# Patient Record
Sex: Male | Born: 1995 | Race: White | Hispanic: No | Marital: Single | Smoking: Former smoker
Health system: Southern US, Community
[De-identification: ages and names within clinical notes are randomized; demographics above are authoritative.]

## PROBLEM LIST (undated history)

## (undated) DIAGNOSIS — J302 Other seasonal allergic rhinitis: Secondary | ICD-10-CM

## (undated) DIAGNOSIS — J45909 Unspecified asthma, uncomplicated: Secondary | ICD-10-CM

## (undated) DIAGNOSIS — R112 Nausea with vomiting, unspecified: Secondary | ICD-10-CM

## (undated) DIAGNOSIS — F419 Anxiety disorder, unspecified: Secondary | ICD-10-CM

## (undated) DIAGNOSIS — R519 Headache, unspecified: Secondary | ICD-10-CM

## (undated) DIAGNOSIS — U071 COVID-19: Secondary | ICD-10-CM

## (undated) DIAGNOSIS — T783XXA Angioneurotic edema, initial encounter: Secondary | ICD-10-CM

## (undated) DIAGNOSIS — Z87442 Personal history of urinary calculi: Secondary | ICD-10-CM

## (undated) DIAGNOSIS — K219 Gastro-esophageal reflux disease without esophagitis: Secondary | ICD-10-CM

## (undated) DIAGNOSIS — F329 Major depressive disorder, single episode, unspecified: Secondary | ICD-10-CM

## (undated) DIAGNOSIS — J189 Pneumonia, unspecified organism: Secondary | ICD-10-CM

## (undated) DIAGNOSIS — Z9889 Other specified postprocedural states: Secondary | ICD-10-CM

## (undated) DIAGNOSIS — N200 Calculus of kidney: Secondary | ICD-10-CM

## (undated) DIAGNOSIS — F32A Depression, unspecified: Secondary | ICD-10-CM

## (undated) HISTORY — PX: TONSILLECTOMY: SUR1361

## (undated) HISTORY — DX: Pneumonia, unspecified organism: J18.9

## (undated) HISTORY — DX: Unspecified asthma, uncomplicated: J45.909

## (undated) HISTORY — DX: Anxiety disorder, unspecified: F41.9

## (undated) HISTORY — DX: Calculus of kidney: N20.0

## (undated) HISTORY — DX: COVID-19: U07.1

## (undated) HISTORY — DX: Major depressive disorder, single episode, unspecified: F32.9

## (undated) HISTORY — DX: Angioneurotic edema, initial encounter: T78.3XXA

## (undated) HISTORY — DX: Depression, unspecified: F32.A

## (undated) HISTORY — PX: WISDOM TOOTH EXTRACTION: SHX21

## (undated) HISTORY — PX: NO PAST SURGERIES: SHX2092

---
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
## 2016-07-17 ENCOUNTER — Encounter (HOSPITAL_COMMUNITY): Payer: Self-pay | Admitting: Emergency Medicine

## 2016-07-17 ENCOUNTER — Emergency Department (HOSPITAL_COMMUNITY): Payer: BLUE CROSS/BLUE SHIELD

## 2016-07-17 ENCOUNTER — Emergency Department (HOSPITAL_COMMUNITY)
Admission: EM | Admit: 2016-07-17 | Discharge: 2016-07-17 | Disposition: A | Discharge status: Home / Self Care | Payer: BLUE CROSS/BLUE SHIELD | Attending: Emergency Medicine | Admitting: Emergency Medicine

## 2016-07-17 DIAGNOSIS — Z79899 Other long term (current) drug therapy: Secondary | ICD-10-CM | POA: Diagnosis not present

## 2016-07-17 DIAGNOSIS — R05 Cough: Secondary | ICD-10-CM | POA: Diagnosis not present

## 2016-07-17 DIAGNOSIS — R062 Wheezing: Secondary | ICD-10-CM | POA: Diagnosis not present

## 2016-07-17 DIAGNOSIS — R0602 Shortness of breath: Secondary | ICD-10-CM | POA: Diagnosis not present

## 2016-07-17 DIAGNOSIS — R0789 Other chest pain: Secondary | ICD-10-CM | POA: Insufficient documentation

## 2016-07-17 DIAGNOSIS — F17200 Nicotine dependence, unspecified, uncomplicated: Secondary | ICD-10-CM | POA: Diagnosis not present

## 2016-07-17 DIAGNOSIS — R079 Chest pain, unspecified: Secondary | ICD-10-CM | POA: Diagnosis not present

## 2016-07-17 LAB — CBC WITH DIFFERENTIAL/PLATELET
BASOS ABS: 0 10*3/uL (ref 0.0–0.1)
BASOS PCT: 0 %
Eosinophils Absolute: 0 10*3/uL (ref 0.0–0.7)
Eosinophils Relative: 1 %
HEMATOCRIT: 40 % (ref 39.0–52.0)
Hemoglobin: 13.7 g/dL (ref 13.0–17.0)
Lymphocytes Relative: 24 %
Lymphs Abs: 2 10*3/uL (ref 0.7–4.0)
MCH: 29.7 pg (ref 26.0–34.0)
MCHC: 34.3 g/dL (ref 30.0–36.0)
MCV: 86.8 fL (ref 78.0–100.0)
MONO ABS: 0.7 10*3/uL (ref 0.1–1.0)
Monocytes Relative: 9 %
NEUTROS ABS: 5.7 10*3/uL (ref 1.7–7.7)
NEUTROS PCT: 66 %
Platelets: 249 10*3/uL (ref 150–400)
RBC: 4.61 MIL/uL (ref 4.22–5.81)
RDW: 13.2 % (ref 11.5–15.5)
WBC: 8.5 10*3/uL (ref 4.0–10.5)

## 2016-07-17 LAB — COMPREHENSIVE METABOLIC PANEL
ALT: 16 U/L — ABNORMAL LOW (ref 17–63)
AST: 17 U/L (ref 15–41)
Albumin: 4.4 g/dL (ref 3.5–5.0)
Alkaline Phosphatase: 47 U/L (ref 38–126)
Anion gap: 7 (ref 5–15)
BILIRUBIN TOTAL: 1.1 mg/dL (ref 0.3–1.2)
BUN: 8 mg/dL (ref 6–20)
CO2: 28 mmol/L (ref 22–32)
Calcium: 9.4 mg/dL (ref 8.9–10.3)
Chloride: 102 mmol/L (ref 101–111)
Creatinine, Ser: 0.81 mg/dL (ref 0.61–1.24)
GFR calc Af Amer: >60 mL/min (ref >60)
Glucose, Bld: 97 mg/dL (ref 65–99)
POTASSIUM: 4 mmol/L (ref 3.5–5.1)
Sodium: 137 mmol/L (ref 135–145)
TOTAL PROTEIN: 7.5 g/dL (ref 6.5–8.1)

## 2016-07-17 LAB — LIPASE, BLOOD: LIPASE: 30 U/L (ref 11–51)

## 2016-07-17 LAB — I-STAT CG4 LACTIC ACID, ED: Lactic Acid, Venous: 0.95 mmol/L (ref 0.5–1.9)

## 2016-07-17 LAB — I-STAT TROPONIN, ED: TROPONIN I, POC: 0 ng/mL (ref 0.00–0.08)

## 2016-07-17 LAB — PROTIME-INR
INR: 1.08
Prothrombin Time: 14.1 seconds (ref 11.4–15.2)

## 2016-07-17 LAB — D-DIMER, QUANTITATIVE: D-Dimer, Quant: <0.27 ug/mL-FEU (ref 0.00–0.50)

## 2016-07-17 MED ORDER — IPRATROPIUM-ALBUTEROL 0.5-2.5 (3) MG/3ML IN SOLN
3.0000 mL | Freq: Once | RESPIRATORY_TRACT | Status: AC
  Administered 2016-07-17: 3 mL via RESPIRATORY_TRACT
  Filled 2016-07-17: qty 3

## 2016-07-17 MED ORDER — PREDNISONE 50 MG PO TABS: 50.0000 mg | ORAL_TABLET | Freq: Every day | ORAL | 0 refills | Status: AC

## 2016-07-17 MED ORDER — GI COCKTAIL ~~LOC~~
30.0000 mL | Freq: Once | ORAL | Status: AC
  Administered 2016-07-17: 30 mL via ORAL
  Filled 2016-07-17: qty 30

## 2016-07-17 NOTE — ED Provider Notes (Signed)
[facility]-EMERGENCY DEPT Provider Note   CSN: [id number] Arrival date & time: 07/17/16  1121     History   Chief Complaint Chief Complaint  Patient presents with  . Chest Pain    HPI Dave Hernandez is a 21 y.o. male.  The history is provided by the patient and a relative.  Shortness of Breath  This is a new problem. The average episode lasts 1 week. The problem occurs continuously.The current episode started more than 2 days ago. The problem has not changed since onset.Associated symptoms include cough, wheezing and chest pain (tightness). Pertinent negatives include no fever, no headaches, no rhinorrhea, no neck pain, no sputum production, no hemoptysis, no syncope, no vomiting, no abdominal pain, no rash, no leg pain and no leg swelling. The problem's precipitants include pollens and smoke. Risk factors include smoking. He has tried beta-agonist inhalers for the symptoms. The treatment provided moderate relief. Associated medical issues include asthma and pneumonia (hx of). Associated medical issues do not include COPD, PE, CAD, heart failure, past MI or DVT.    History reviewed. No pertinent past medical history.  There are no active problems to display for this patient.   History reviewed. No pertinent surgical history.     Home Medications    Prior to Admission medications   Medication Sig Start Date End Date Taking? Authorizing Provider  albuterol (PROVENTIL HFA;VENTOLIN HFA) 108 (90 Base) MCG/ACT inhaler Inhale 1 puff into the lungs every 6 (six) hours as needed for wheezing or shortness of breath.   Yes Historical Provider, MD  calcium carbonate (TUMS - DOSED IN MG ELEMENTAL CALCIUM) 500 MG chewable tablet Chew 1 tablet by mouth as needed for indigestion or heartburn.   Yes Historical Provider, MD  ibuprofen (ADVIL,MOTRIN) 200 MG tablet Take 600 mg by mouth every 6 (six) hours as needed for headache or moderate pain.   Yes Historical Provider, MD    Family  History History reviewed. No pertinent family history.  Social History Social History  Substance Use Topics  . Smoking status: Current Every Day Smoker  . Smokeless tobacco: Never Used  . Alcohol use Yes     Allergies   Penicillins   Review of Systems Review of Systems  Constitutional: Negative for appetite change, chills, diaphoresis, fatigue and fever.  HENT: Negative for congestion and rhinorrhea.   Eyes: Negative for visual disturbance.  Respiratory: Positive for cough, chest tightness, shortness of breath and wheezing. Negative for hemoptysis, sputum production and choking.   Cardiovascular: Positive for chest pain (tightness). Negative for palpitations, leg swelling and syncope.  Gastrointestinal: Negative for abdominal pain, constipation, diarrhea, nausea and vomiting.  Genitourinary: Negative for frequency.  Musculoskeletal: Negative for back pain and neck pain.  Skin: Negative for rash and wound.  Neurological: Negative for light-headedness and headaches.  Psychiatric/Behavioral: Negative for agitation and confusion.  All other systems reviewed and are negative.    Physical Exam Updated Vital Signs BP 133/72   Pulse 64   Temp 98.2 F (36.8 C) (Oral)   Resp 16   SpO2 95%   Physical Exam  Constitutional: He is oriented to person, place, and time. He appears well-developed and well-nourished.  HENT:  Head: Normocephalic and atraumatic.  Mouth/Throat: Oropharynx is clear and moist. No oropharyngeal exudate.  Eyes: Conjunctivae and EOM are normal. Pupils are equal, round, and reactive to light.  Neck: Normal range of motion. Neck supple.  Cardiovascular: Normal rate, regular rhythm and intact distal pulses.   No murmur heard.  Strong heart beats can heard but no murmurs.   Pulmonary/Chest: Effort normal. No stridor. No respiratory distress. He has wheezes. He has no rales. He exhibits no tenderness.  Abdominal: Soft. There is no tenderness.  Musculoskeletal:  He exhibits no edema or tenderness.  Neurological: He is alert and oriented to person, place, and time. No sensory deficit. He exhibits normal muscle tone.  Skin: Skin is warm and dry. Capillary refill takes less than 2 seconds. No erythema. No pallor.  Psychiatric: He has a normal mood and affect.  Nursing note and vitals reviewed.    ED Treatments / Results  Labs (all labs ordered are listed, but only abnormal results are displayed) Labs Reviewed  COMPREHENSIVE METABOLIC PANEL - Abnormal; Notable for the following:       Result Value   ALT 16 (*)    All other components within normal limits  CBC WITH DIFFERENTIAL/PLATELET  LIPASE, BLOOD  PROTIME-INR  D-DIMER, QUANTITATIVE (NOT AT [facility])  Rosezena Sensor, ED  I-STAT CG4 LACTIC ACID, ED    EKG  EKG Interpretation  Date/Time:  Tuesday Jul 17 2016 13:36:38 EDT Ventricular Rate:  56 PR Interval:    QRS Duration: 101 QT Interval:  417 QTC Calculation: 403 R Axis:   84 Text Interpretation:  Sinus rhythm Probable LVH with secondary repol abnrm Anterior ST elevation, probably due to LVH Baseline wander in lead(s) I aVR No prior ECG for comparison.  No STEMI Confirmed by Rush Landmark MD, CHRISTOPHER [phone]) on 07/17/2016 1:42:28 PM Also confirmed by Rush Landmark MD, CHRISTOPHER [phone]), editor Rollen Sox [phone])  on 07/17/2016 3:33:06 PM       Radiology Dg Chest 2 View  Result Date: 07/17/2016 CLINICAL DATA:  Mid chest pain and cough. EXAM: CHEST  2 VIEW COMPARISON:  PA and lateral chest 09/03/2006. FINDINGS: The lungs are clear. Heart size is normal. No pneumothorax or pleural effusion. No bony abnormality. IMPRESSION: Negative chest. Electronically Signed   By: Drusilla Kanner M.D.   On: 07/17/2016 12:23    Procedures Procedures (including critical care time)  Medications Ordered in ED Medications  ipratropium-albuterol (DUONEB) 0.5-2.5 (3) MG/3ML nebulizer solution 3 mL (3 mLs Nebulization Given 07/17/16 1340)  gi cocktail  (Maalox,Lidocaine,Donnatal) (30 mLs Oral Given 07/17/16 1340)     Initial Impression / Assessment and Plan / ED Course  I have reviewed the triage vital signs and the nursing notes.  Pertinent labs & imaging results that were available during my care of the patient were reviewed by me and considered in my medical decision making (see chart for details).     Dave Hernandez is a 21 y.o. male With a past medical history significant for asthma and pneumonias who presents to the emergency department with Shortness of breath, wheezing, chest tightness, and chest discomfort. Patient is accompanied by family who reported that for the last week, patient has had his symptoms. He says that he has been sending lots of time outside. He also reports smoking tobacco. Patient describes that his chest is felt tight and he cannot seem to catch his breath. He reports a burning and tightness in his chest. He denies over chest pain. He describes his symptoms as moderate to severe. He denies any fevers or chills but does report a dry cough. He denies any urinary symptoms or G.I. symptoms. He denies any traumas. Patient denies any lightheadedness, syncope, diaphoresis, or nausea. He denies other complaints.  History and exam are seen above.  On exam, patient's lungs had mild  wheezing. Patient had no rhonchi or rales. Patients chest was nontender to palpation. Abdomen nontender. Symmetric pulses in on extremity is. Patient had no murmur but had a very strong and loud heartbeat. Patient had no back tenderness or CV tenderness. No rashes were seen. Normal lower extremity pulses.  Based on patient symptoms, suspect asthma exacerbation in the setting of his smoke exposure and severe environmental allergies from pollen. However, given patient's history of pneumonias, cough, and shortness of breath, labs, imaging, and testing was performed to rule out cardiac, pulmonary, and pneumonia cause of symptoms.  Troponin negative. D  dimer negative. Lactic acid nonelevated. CBC and CMP unremarkable. Chest x-ray showed no pneumonia or edema.  Only abnormality on workup was possible LVH on EKG. Patient denies any family history of cardiac disease or death.  Given the patient's wheezing and shortness of breath, DuoNeb breathing treatment was ordered. With the burning that he reports was associated with a "[employer] spicy chicken sandwich", a G.I. cocktail was also ordered.  Patient's wheezing and shortness of breath significantly improved after breathing treatment. Suspect asthma exacerbation as described above.  Given the LVH and the strong sounding heart, patient offered echocardiogram in the emergency department to investigate, however, patient had shared decision-making conversation and decision made to follow up as an outpatient with PCP and cardiology for further management. Patient given prescription for steroids for asthma exacerbation and patient has inhaler at home. Patient will follow up and understood strict return precautions for any new or worsened symptoms. Patient and family had no other questions or concerns, and patient was discharged in good condition with improvement in presenting symptoms.    Final Clinical Impressions(s) / ED Diagnoses   Final diagnoses:  Chest tightness  Shortness of breath  Wheezing    New Prescriptions Discharge Medication List as of 07/17/2016  3:04 PM    START taking these medications   Details  predniSONE (DELTASONE) 50 MG tablet Take 1 tablet (50 mg total) by mouth daily., Starting Tue 07/17/2016, Until Sun 07/22/2016, Print        Clinical Impression: 1. Chest tightness   2. Shortness of breath   3. Wheezing     Disposition: Discharge  Condition: Good  I have discussed the results, Dx and Tx plan with the pt(& family if present). He/she/they expressed understanding and agree(s) with the plan. Discharge instructions discussed at great length. Strict return precautions  discussed and pt &/or family have verbalized understanding of the instructions. No further questions at time of discharge.    Discharge Medication List as of 07/17/2016  3:04 PM    START taking these medications   Details  predniSONE (DELTASONE) 50 MG tablet Take 1 tablet (50 mg total) by mouth daily., Starting Tue 07/17/2016, Until Sun 07/22/2016, Print        Follow Up: [facility] [address] [place] [address] [phone] Schedule an appointment as soon as possible for a visit    [facility] CARDIOVASCULAR DIVISION [address] [place] [address] [phone] Schedule an appointment as soon as possible for a visit    [facility] EMERGENCY DEPARTMENT [address] [id number] mc [place] [address] [phone]  If symptoms worsen     Heide Scales, MD 07/17/16 2000

## 2016-07-17 NOTE — ED Triage Notes (Signed)
Pt sts chest tightness and some cough; pt sts had dental abscess rupture 1 week ago

## 2016-07-17 NOTE — ED Notes (Signed)
Pt given ice water, per Chelsea, RN. 

## 2016-07-17 NOTE — ED Notes (Signed)
Patient transported to X-ray 

## 2016-07-17 NOTE — ED Notes (Signed)
Dr. Tegler at bedside  

## 2016-07-17 NOTE — Discharge Instructions (Signed)
Please call and schedule an appointment with a primary care physician as well as a cardiologist for further workup of your symptoms. We feel you're having an asthma exacerbation causing the wheezing, shortness of breath, and chest tightness. It improved after your breathing treatments. Please take the steroids for the next several days. If any symptoms change or worsen, please return to the nearest emergency department.

## 2016-07-17 NOTE — ED Notes (Signed)
Got patient undress into a gown on the monitor waiting provider

## 2016-07-19 ENCOUNTER — Encounter: Payer: Self-pay | Admitting: Family Medicine

## 2016-07-19 ENCOUNTER — Ambulatory Visit (INDEPENDENT_AMBULATORY_CARE_PROVIDER_SITE_OTHER): Payer: BLUE CROSS/BLUE SHIELD | Admitting: Family Medicine

## 2016-07-19 VITALS — BP 142/72 | HR 70 | Temp 97.9°F | Resp 16 | Ht 75.0 in | Wt 172.0 lb

## 2016-07-19 DIAGNOSIS — J45909 Unspecified asthma, uncomplicated: Secondary | ICD-10-CM

## 2016-07-19 DIAGNOSIS — F418 Other specified anxiety disorders: Secondary | ICD-10-CM | POA: Diagnosis not present

## 2016-07-19 DIAGNOSIS — R0602 Shortness of breath: Secondary | ICD-10-CM | POA: Diagnosis not present

## 2016-07-19 MED ORDER — VENLAFAXINE HCL ER 150 MG PO CP24: 150.0000 mg | ORAL_CAPSULE | Freq: Every day | ORAL | 0 refills | Status: DC

## 2016-07-19 MED ORDER — BUDESONIDE-FORMOTEROL FUMARATE 80-4.5 MCG/ACT IN AERO: 2.0000 | INHALATION_SPRAY | Freq: Two times a day (BID) | RESPIRATORY_TRACT | 3 refills | Status: DC

## 2016-07-19 MED ORDER — MONTELUKAST SODIUM 10 MG PO TABS: 10.0000 mg | ORAL_TABLET | Freq: Every day | ORAL | 3 refills | Status: DC

## 2016-07-19 MED ORDER — CETIRIZINE HCL 10 MG PO TABS: 10.0000 mg | ORAL_TABLET | Freq: Every day | ORAL | 11 refills | Status: DC

## 2016-07-19 MED ORDER — IPRATROPIUM BROMIDE 0.02 % IN SOLN
0.5000 mg | Freq: Once | RESPIRATORY_TRACT | Status: AC
  Administered 2016-07-19: 0.5 mg via RESPIRATORY_TRACT

## 2016-07-19 MED ORDER — ALBUTEROL SULFATE (2.5 MG/3ML) 0.083% IN NEBU
2.5000 mg | INHALATION_SOLUTION | Freq: Once | RESPIRATORY_TRACT | Status: AC
  Administered 2016-07-19: 2.5 mg via RESPIRATORY_TRACT

## 2016-07-19 NOTE — Progress Notes (Signed)
Patient ID: Dave Hernandez, male    DOB: 1995/11/23, 21 y.o.   MRN: [id number]  PCP: Joaquin Courts, FNP  Chief Complaint  Patient presents with  . Establish Care  . Hospitalization Follow-up    Subjective:  HPI  Dave Hernandez is a 21 y.o. male presents to establish care and hospital follow-up. Significant medical history includes asthma and seasonal allergies.  Asthma/Allergies Dave Hernandez describes worsening chest tightness with shortness of breath with activity over a period of two weeks. He admits to chronic vapeing and marijuana use. Feels chest tightness, is related to vapeing more so that marijuana smoking as he reports long-time usage. Events that led up to recent emergency department encounter include, 4/30 experiencing a sudden onset of heart racing and chest tightness (not pain) to the point of feeling as if "someone was standing on his chest blocking his throat". He became afraid and went to [facility] Emergency Department for further work-up and evaluation. Upon arrival to the ED,  Dave Hernandez reported associated cough, wheezing, shortness of breath, and chest pain.  Symptoms were further exacerbated by exposure pollen and smoking. He admits today that he last smoked marijuana on Monday. Cardiac cause was ruled out. He was treated symptomatically only and discharged with instructions to start a 5 day coarse of prednisone and use albuterol inhaler as needed. As of today, he reports improvement of chest tightness, although sensation of tightness remains and he continues to experience shortness of breath. He has used albuterol with relief of chest tightness. Not currently taking an chronic antihistamines or using any asthma prevention medications.  Anxiety and Depression  Dave Hernandez reports over the last 6 months feeling hopeless and worthless .  "I had hoped this year, my life would be better and things in my personal life seemed to have gotten worst".  He is lost his mother in  2008, grandmother passed 1 year ago, and close friend passed this week.  He feels alone and finds difficulty expressing his feelings verbally. No prior diagnosis of depression or anxiety. Feels that his chest tightness is in some part related to stress.  Feels that emotionally he is getting worst as he is crying for no reason. Reports smoking marijuana is the only thing that helps him relax and feel normal. Denies suicidal thoughts and or plan.    Social History   Social History  . Marital status: Single    Spouse name: N/A  . Number of children: N/A  . Years of education: N/A   Occupational History  . Not on file.   Social History Main Topics  . Smoking status: Current Every Day Smoker  . Smokeless tobacco: Never Used  . Alcohol use Yes  . Drug use: Yes    Types: Marijuana  . Sexual activity: Not on file   Other Topics Concern  . Not on file   Social History Narrative  . No narrative on file    History reviewed. No pertinent family history.  Review of Systems See HPI  Allergies  Allergen Reactions  . Penicillins Other (See Comments)    Childhood allergy    Prior to Admission medications   Medication Sig Start Date End Date Taking? Authorizing Provider  predniSONE (DELTASONE) 50 MG tablet Take 1 tablet (50 mg total) by mouth daily. 07/17/16 07/22/16 Yes Canary Brim Tegeler, MD  albuterol (PROVENTIL HFA;VENTOLIN HFA) 108 (90 Base) MCG/ACT inhaler Inhale 1 puff into the lungs every 6 (six) hours as needed for wheezing or shortness of  breath.    Historical Provider, MD  calcium carbonate (TUMS - DOSED IN MG ELEMENTAL CALCIUM) 500 MG chewable tablet Chew 1 tablet by mouth as needed for indigestion or heartburn.    Historical Provider, MD  ibuprofen (ADVIL,MOTRIN) 200 MG tablet Take 600 mg by mouth every 6 (six) hours as needed for headache or moderate pain.    Historical Provider, MD    Past Medical, Surgical Family and Social History reviewed and updated.     Objective:   Today's Vitals   07/19/16 1057  BP: (!) 142/77  Pulse: 70  Resp: 16  Temp: 97.9 F (36.6 C)  SpO2: 100%  Weight: 172 lb (78 kg)  Height: 6\' 3"  (1.905 m)    Wt Readings from Last 3 Encounters:  07/19/16 172 lb (78 kg)   Physical Exam  Constitutional: He is oriented to person, place, and time. He appears well-developed and well-nourished.  HENT:  Head: Normocephalic and atraumatic.  Eyes: Pupils are equal, round, and reactive to light.  Neck: Normal range of motion. Neck supple.  Cardiovascular: Normal rate, regular rhythm, normal heart sounds and intact distal pulses.   Pulmonary/Chest: Effort normal and breath sounds normal.  Musculoskeletal: Normal range of motion.  Neurological: He is alert and oriented to person, place, and time.  Skin: Skin is warm and dry.  Psychiatric: He has a normal mood and affect. His behavior is normal. Judgment and thought content normal.     Assessment & Plan:  1. Depression with anxiety - Venlafaxine (Effexor) 150 mg once daily.  2. Uncomplicated asthma, unspecified asthma severity, unspecified whether persistent -Symbicort 2 puffs, twice daily regardless of symptoms -Albuterol 2 puffs, every 4-6 hours as needed  3. Shortness of breath, Subjective, asymptomatic on exam. - albuterol (PROVENTIL) (2.5 MG/3ML) 0.083% nebulizer solution 2.5 mg; Take 3 mLs (2.5 mg total) by nebulization once. - ipratropium (ATROVENT) nebulizer solution 0.5 mg; Take 2.5 mLs (0.5 mg total) by nebulization once.   RTC: 3 weeks to follow-up on anxiety,depression, and asthma symptoms.    Godfrey PickKimberly S. Tiburcio PeaHarris, MSN, [facility] Campus[facility]NP-C [facility] [address] [place], [place] [id number] [phone]

## 2016-07-19 NOTE — Patient Instructions (Addendum)
For depression and anxiety -Start Effexor 150 mg once daily    Uncomplicated asthma and allergy -Symbicort 2 puffs, twice daily regardless of symptoms. -Albuterol 2 puffs, every 4-6 hours as needed. -Singulair 10 mg daily regardless of symptoms -Start over the counter Zyrtec 10 mg once daily.     Panic Attacks Panic attacks are sudden, short feelings of great fear or discomfort. You may have them for no reason when you are relaxed, when you are uneasy (anxious), or when you are sleeping. Follow these instructions at home:  Take all your medicines as told.  Check with your doctor before starting new medicines.  Keep all doctor visits. Contact a doctor if:  You are not able to take your medicines as told.  Your symptoms do not get better.  Your symptoms get worse. Get help right away if:  Your attacks seem different than your normal attacks.  You have thoughts about hurting yourself or others.  You take panic attack medicine and you have a side effect. This information is not intended to replace advice given to you by your health care provider. Make sure you discuss any questions you have with your health care provider. Document Released: 04/07/2010 Document Revised: 08/11/2015 Document Reviewed: 10/17/2012 Elsevier Interactive Patient Education  2017 Elsevier Inc.  Living With Anxiety After being diagnosed with an anxiety disorder, you may be relieved to know why you have felt or behaved a certain way. It is natural to also feel overwhelmed about the treatment ahead and what it will mean for your life. With care and support, you can manage this condition and recover from it. How to cope with anxiety Dealing with stress  Stress is your body's reaction to life changes and events, both good and bad. Stress can last just a few hours or it can be ongoing. Stress can play a major role in anxiety, so it is important to learn both how to cope with stress and how to think about it  differently. Talk with your health care provider or a counselor to learn more about stress reduction. He or she may suggest some stress reduction techniques, such as:  Music therapy. This can include creating or listening to music that you enjoy and that inspires you.  Mindfulness-based meditation. This involves being aware of your normal breaths, rather than trying to control your breathing. It can be done while sitting or walking.  Centering prayer. This is a kind of meditation that involves focusing on a word, phrase, or sacred image that is meaningful to you and that brings you peace.  Deep breathing. To do this, expand your stomach and inhale slowly through your nose. Hold your breath for 3-5 seconds. Then exhale slowly, allowing your stomach muscles to relax.  Self-talk. This is a skill where you identify thought patterns that lead to anxiety reactions and correct those thoughts.  Muscle relaxation. This involves tensing muscles then relaxing them. Choose a stress reduction technique that fits your lifestyle and personality. Stress reduction techniques take time and practice. Set aside 5-15 minutes a day to do them. Therapists can offer training in these techniques. The training may be covered by some insurance plans. Other things you can do to manage stress include:  Keeping a stress diary. This can help you learn what triggers your stress and ways to control your response.  Thinking about how you respond to certain situations. You may not be able to control everything, but you can control your reaction.  Making time for  activities that help you relax, and not feeling guilty about spending your time in this way. Therapy combined with coping and stress-reduction skills provides the best chance for successful treatment. Medicines  Medicines can help ease symptoms. Medicines for anxiety include:  Anti-anxiety drugs.  Antidepressants.  Beta-blockers. Medicines may be used as the main  treatment for anxiety disorder, along with therapy, or if other treatments are not working. Medicines should be prescribed by a health care provider. Relationships  Relationships can play a big part in helping you recover. Try to spend more time connecting with trusted friends and family members. Consider going to couples counseling, taking family education classes, or going to family therapy. Therapy can help you and others better understand the condition. How to recognize changes in your condition Everyone has a different response to treatment for anxiety. Recovery from anxiety happens when symptoms decrease and stop interfering with your daily activities at home or work. This may mean that you will start to:  Have better concentration and focus.  Sleep better.  Be less irritable.  Have more energy.  Have improved memory. It is important to recognize when your condition is getting worse. Contact your health care provider if your symptoms interfere with home or work and you do not feel like your condition is improving. Where to find help and support: You can get help and support from these sources:  Self-help groups.  Online and [employer].  A trusted spiritual leader.  Couples counseling.  Family education classes.  Family therapy. Follow these instructions at home:  Eat a healthy diet that includes plenty of vegetables, fruits, whole grains, low-fat dairy products, and lean protein. Do not eat a lot of foods that are high in solid fats, added sugars, or salt.  Exercise. Most adults should do the following:  Exercise for at least 150 minutes each week. The exercise should increase your heart rate and make you sweat (moderate-intensity exercise).  Strengthening exercises at least twice a week.  Cut down on caffeine, tobacco, alcohol, and other potentially harmful substances.  Get the right amount and quality of sleep. Most adults need 7-9 hours of sleep each  night.  Make choices that simplify your life.  Take over-the-counter and prescription medicines only as told by your health care provider.  Avoid caffeine, alcohol, and certain over-the-counter cold medicines. These may make you feel worse. Ask your pharmacist which medicines to avoid.  Keep all follow-up visits as told by your health care provider. This is important. Questions to ask your health care provider  Would I benefit from therapy?  How often should I follow up with a health care provider?  How long do I need to take medicine?  Are there any long-term side effects of my medicine?  Are there any alternatives to taking medicine? Contact a health care provider if:  You have a hard time staying focused or finishing daily tasks.  You spend many hours a day feeling worried about everyday life.  You become exhausted by worry.  You start to have headaches, feel tense, or have nausea.  You urinate more than normal.  You have diarrhea. Get help right away if:  You have a racing heart and shortness of breath.  You have thoughts of hurting yourself or others. If you ever feel like you may hurt yourself or others, or have thoughts about taking your own life, get help right away. You can go to your nearest emergency department or call:  Your local emergency  services (911 in the [place]  A suicide crisis helpline, such as the National Suicide Prevention Lifeline at (548)453-3947. This is open 24-hours a day. Summary  Taking steps to deal with stress can help calm you.  Medicines cannot cure anxiety disorders, but they can help ease symptoms.  Family, friends, and partners can play a big part in helping you recover from an anxiety disorder. This information is not intended to replace advice given to you by your health care provider. Make sure you discuss any questions you have with your health care provider. Document Released: 02/28/2016 Document Revised: 02/28/2016  Document Reviewed: 02/28/2016 Elsevier Interactive Patient Education  2017 Elsevier Inc.  Major Depressive Disorder, Adult Major depressive disorder (MDD) is a mental health condition. MDD often makes you feel sad, hopeless, or helpless. MDD can also cause symptoms in your body. MDD can affect your:  Work.  School.  Relationships.  Other normal activities. MDD can range from mild to very bad. It may occur once (single episode MDD). It can also occur many times (recurrent MDD). The main symptoms of MDD often include:  Feeling sad, depressed, or irritable most of the time.  Loss of interest. MDD symptoms also include:  Sleeping too much or too little.  Eating too much or too little.  A change in your weight.  Feeling tired (fatigue) or having low energy.  Feeling worthless.  Feeling guilty.  Trouble making decisions.  Trouble thinking clearly.  Thoughts of suicide or harming others.  Feeling weak.  Feeling agitated.  Keeping yourself from being around other people (isolation). Follow these instructions at home: Activity   Do these things as told by your doctor:  Go back to your normal activities.  Exercise regularly.  Spend time outdoors. Alcohol   Talk with your doctor about how alcohol can affect your antidepressant medicines.  Do not drink alcohol. Or, limit how much alcohol you drink.  This means no more than 1 drink a day for nonpregnant women and 2 drinks a day for men. One drink equals one of these:  12 oz of beer.  5 oz of wine.  1 oz of hard liquor. General instructions   Take over-the-counter and prescription medicines only as told by your doctor.  Eat a healthy diet.  Get plenty of sleep.  Find activities that you enjoy. Make time to do them.  Think about joining a support group. Your doctor may be able to suggest a group for you.  Keep all follow-up visits as told by your doctor. This is important. Where to find more  information:   [employer] on Mental Illness:  www.nami.org  [place] [employer] of Mental Health:  http://www.maynard.net/  National Suicide Prevention Lifeline:  315-764-7621. This is free, 24-hour help. Contact a doctor if:  Your symptoms get worse.  You have new symptoms. Get help right away if:  You self-harm.  You see, hear, taste, smell, or feel things that are not present (hallucinate). If you ever feel like you may hurt yourself or others, or have thoughts about taking your own life, get help right away. You can go to your nearest emergency department or call:  Your local emergency services (911 in the [place]  A suicide crisis helpline, such as the National Suicide Prevention Lifeline:  (980)538-8587. This is open 24 hours a day. This information is not intended to replace advice given to you by your health care provider. Make sure you discuss any questions you have with your health care  provider. Document Released: 02/14/2015 Document Revised: 11/20/2015 Document Reviewed: 11/20/2015 Elsevier Interactive Patient Education  2017 [employer]

## 2016-07-20 LAB — POCT URINALYSIS DIP (DEVICE)
BILIRUBIN URINE: NEGATIVE
GLUCOSE, UA: NEGATIVE mg/dL
KETONES UR: NEGATIVE mg/dL
Leukocytes, UA: NEGATIVE
Nitrite: NEGATIVE
Protein, ur: NEGATIVE mg/dL
SPECIFIC GRAVITY, URINE: 1.02 (ref 1.005–1.030)
Urobilinogen, UA: 0.2 mg/dL (ref 0.0–1.0)
pH: 6.5 (ref 5.0–8.0)

## 2016-08-09 ENCOUNTER — Encounter: Payer: Self-pay | Admitting: Family Medicine

## 2016-08-09 ENCOUNTER — Ambulatory Visit (INDEPENDENT_AMBULATORY_CARE_PROVIDER_SITE_OTHER): Payer: BLUE CROSS/BLUE SHIELD | Admitting: Family Medicine

## 2016-08-09 VITALS — BP 135/77 | HR 60 | Temp 97.8°F | Resp 16 | Ht 75.0 in | Wt 184.0 lb

## 2016-08-09 DIAGNOSIS — J45909 Unspecified asthma, uncomplicated: Secondary | ICD-10-CM | POA: Diagnosis not present

## 2016-08-09 DIAGNOSIS — F418 Other specified anxiety disorders: Secondary | ICD-10-CM

## 2016-08-09 NOTE — Progress Notes (Signed)
Patient ID: Dave HildingBrandon L Hernandez, male    DOB: 03/13/1996, 21 y.o.   MRN: [id number]  PCP: Bing NeighborsHarris, Jomari Bartnik S, FNP  Chief Complaint  Patient presents with  . Follow-up    Depression,Anxiety    Subjective:  HPI Dave Hernandez is a 21 y.o. male presents for 2 week follow-up of anxiety and depression. Significant medical history includes asthma and seasonal allergies.  During Nikai's last office visit, he was treated for shortness of breath related to asthma and anxiety. He was found to be experiencing significant depression and started on Effexor. He had been chronically smoking marijuana to cope with symptoms.  Today he presents reporting improvement of symptoms, despite opting not to start antidepressant medication. Dave JunesBrandon was concerned with the side effects of medication and felt that he did not need antidepressant therapy.  He has been regularly using prescribed inhaler, although inappropriately. He has been using the long-acting inhaler for episodes of acute shortness of breath and using the short-acting daily regardless of symptoms. He confirms that he is taking Zrytec 10 mg daily. Overall he feels that his shortness of breath has improved and reports only 1 episode of wheezing.   Social History   Social History  . Marital status: Single    Spouse name: N/A  . Number of children: N/A  . Years of education: N/A   Occupational History  . Not on file.   Social History Main Topics  . Smoking status: Current Every Day Smoker  . Smokeless tobacco: Never Used  . Alcohol use Yes  . Drug use: Yes    Types: Marijuana  . Sexual activity: Not on file   Other Topics Concern  . Not on file   Social History Narrative  . No narrative on file   History reviewed. No pertinent family history.   Review of Systems See HPI Patient Active Problem List   Diagnosis Date Noted  . Depression with anxiety 07/19/2016  . Uncomplicated asthma 07/19/2016  . Shortness of breath  07/19/2016    Allergies  Allergen Reactions  . Penicillins Other (See Comments)    Childhood allergy    Prior to Admission medications   Medication Sig Start Date End Date Taking? Authorizing Provider  albuterol (PROVENTIL HFA;VENTOLIN HFA) 108 (90 Base) MCG/ACT inhaler Inhale 1 puff into the lungs every 6 (six) hours as needed for wheezing or shortness of breath.   Yes [provider]  budesonide-formoterol (SYMBICORT) 80-4.5 MCG/ACT inhaler Inhale 2 puffs into the lungs 2 (two) times daily. 07/19/16  Yes Bing NeighborsHarris, Alia Parsley S, FNP  calcium carbonate (TUMS - DOSED IN MG ELEMENTAL CALCIUM) 500 MG chewable tablet Chew 1 tablet by mouth as needed for indigestion or heartburn.   Yes [provider]  cetirizine (ZYRTEC) 10 MG tablet Take 1 tablet (10 mg total) by mouth daily. 07/19/16  Yes Bing NeighborsHarris, Getsemani Lindon S, FNP  ibuprofen (ADVIL,MOTRIN) 200 MG tablet Take 600 mg by mouth every 6 (six) hours as needed for headache or moderate pain.   Yes [provider]  montelukast (SINGULAIR) 10 MG tablet Take 1 tablet (10 mg total) by mouth at bedtime. 07/19/16  Yes Bing NeighborsHarris, Elan Brainerd S, FNP  venlafaxine XR (EFFEXOR XR) 150 MG 24 hr capsule Take 1 capsule (150 mg total) by mouth daily with breakfast. 07/19/16  Yes Bing NeighborsHarris, Elmina Hendel S, FNP    Past Medical, Surgical Family and Social History reviewed and updated.    Objective:   Today's Vitals   08/09/16 1133  BP: 135/77  Pulse: 60  Resp: 16  Temp: 97.8 F (36.6 C)  TempSrc: Oral  SpO2: 99%  Weight: 184 lb (83.5 kg)  Height: 6\' 3"  (1.905 m)    Wt Readings from Last 3 Encounters:  08/09/16 184 lb (83.5 kg)  07/19/16 172 lb (78 kg)    Physical Exam  Constitutional: He is oriented to person, place, and time. He appears well-developed and well-nourished.  HENT:  Head: Normocephalic and atraumatic.  Eyes: Pupils are equal, round, and reactive to light.  Neck: Normal range of motion.  Cardiovascular: Normal rate, regular rhythm,  normal heart sounds and intact distal pulses.   Pulmonary/Chest: Effort normal and breath sounds normal.  Musculoskeletal: Normal range of motion.  Neurological: He is alert and oriented to person, place, and time.  Skin: Skin is warm and dry.  Psychiatric: Judgment and thought content normal. His mood appears anxious. He is hyperactive. Cognition and memory are normal.     Assessment & Plan:  1. Depression with anxiety -Recommended starting Effexor if anxiety and or depression resurfaced. He is to contact me if he decides to take medication as he already filled prescription.  2. Uncomplicated asthma, unspecified asthma severity, unspecified whether persistent -Educated on short acting vs long acting inhaler and indications for use. Patient verbalized understanding and had both inhalers present during visit. -Continue all therapy as previously prescribed.  RTC: 3 months-asthma, depression, and anxiety follow-up.  Godfrey Pick. Tiburcio Pea, MSN, FNP-C [facility]  [address] [place], [place] [address] [phone]

## 2016-08-09 NOTE — Patient Instructions (Signed)
Remember to keep your short acting, immediate release inhaler with you for acute episodes of shortness or breath and or wheezing.    Asthma Attack Prevention, Adult Although you may not be able to control the fact that you have asthma, you can take actions to prevent episodes of asthma (asthma attacks). These actions include:  Creating a written plan for managing and treating your asthma attacks (asthma action plan).  Monitoring your asthma.  Avoiding things that can irritate your airways or make your asthma symptoms worse (asthma triggers).  Taking your medicines as directed.  Acting quickly if you have signs or symptoms of an asthma attack. What are some ways to prevent an asthma attack? Create a plan  Work with your health care provider to create an asthma action plan. This plan should include:  A list of your asthma triggers and how to avoid them.  A list of symptoms that you experience during an asthma attack.  Information about when to take medicine and how much medicine to take.  Information to help you understand your peak flow measurements.  Contact information for your health care providers.  Daily actions that you can take to control asthma. Monitor your asthma   To monitor your asthma:  Use your peak flow meter every morning and every evening for 2-3 weeks. Record the results in a journal. A drop in your peak flow numbers on one or more days may mean that you are starting to have an asthma attack, even if you are not having symptoms.  When you have asthma symptoms, write them down in a journal. Avoid asthma triggers   Work with your health care provider to find out what your asthma triggers are. This can be done by:  Being tested for allergies.  Keeping a journal that notes when asthma attacks occur and what may have contributed to them.  Asking your health care provider whether other medical conditions make your asthma worse. Common asthma triggers  include:  Dust.  Smoke. This includes campfire smoke and secondhand smoke from tobacco products.  Pet dander.  Trees, grasses or pollens.  Very cold, dry, or humid air.  Mold.  Foods that contain high amounts of sulfites.  Strong smells.  Engine exhaust and air pollution.  Aerosol sprays and fumes from household cleaners.  Household pests and their droppings, including dust mites and cockroaches.  Certain medicines, including NSAIDs. Once you have determined your asthma triggers, take steps to avoid them. Depending on your triggers, you may be able to reduce the chance of an asthma attack by:  Keeping your home clean. Have someone dust and vacuum your home for you 1 or 2 times a week. If possible, have them use a high-efficiency particulate arrestance (HEPA) vacuum.  Washing your sheets weekly in hot water.  Using allergy-proof mattress covers and casings on your bed.  Keeping pets out of your home.  Taking care of mold and water problems in your home.  Avoiding areas where people smoke.  Avoiding using strong perfumes or odor sprays.  Avoid spending a lot of time outdoors when pollen counts are high and on very windy days.  Talking with your health care provider before stopping or starting any new medicines. Medicines  Take over-the-counter and prescription medicines only as told by your health care provider. Many asthma attacks can be prevented by carefully following your medicine schedule. Taking your medicines correctly is especially important when you cannot avoid certain asthma triggers. Even if you are doing  well, do not stop taking your medicine and do not take less medicine. Act quickly  If an asthma attack happens, acting quickly can decrease how severe it is and how long it lasts. Take these actions:  Pay attention to your symptoms. If you are coughing, wheezing, or having difficulty breathing, do not wait to see if your symptoms go away on their own.  Follow your asthma action plan.  If you have followed your asthma action plan and your symptoms are not improving, call your health care provider or seek immediate medical care at the nearest hospital. It is important to write down how often you need to use your fast-acting rescue inhaler. You can track how often you use an inhaler in your journal. If you are using your rescue inhaler more often, it may mean that your asthma is not under control. Adjusting your asthma treatment plan may help you to prevent future asthma attacks and help you to gain better control of your condition. How can I prevent an asthma attack when I exercise?   Exercise is a common asthma trigger. To prevent asthma attacks during exercise:  Follow advice from your health care provider about whether you should use your fast-acting inhaler before exercising. Many people with asthma experience exercise-induced bronchoconstriction (EIB). This condition often worsens during vigorous exercise in cold, humid, or dry environments. Usually, people with EIB can stay very active by using a fast-acting inhaler before exercising.  Avoid exercising outdoors in very cold or humid weather.  Avoid exercising outdoors when pollen counts are high.  Warm up and cool down when exercising.  Stop exercising right away if asthma symptoms start. Consider taking part in exercises that are less likely to cause asthma symptoms such as:  Indoor swimming.  Biking.  Walking.  Hiking.  Playing football. This information is not intended to replace advice given to you by your health care provider. Make sure you discuss any questions you have with your health care provider. Document Released: 02/21/2009 Document Revised: 11/04/2015 Document Reviewed: 08/20/2015 Elsevier Interactive Patient Education  2017 [employer]

## 2016-11-13 ENCOUNTER — Ambulatory Visit: Payer: BLUE CROSS/BLUE SHIELD | Admitting: Family Medicine

## 2017-06-07 DIAGNOSIS — L723 Sebaceous cyst: Secondary | ICD-10-CM | POA: Diagnosis not present

## 2018-06-27 DIAGNOSIS — H6990 Unspecified Eustachian tube disorder, unspecified ear: Secondary | ICD-10-CM | POA: Diagnosis not present

## 2019-04-06 ENCOUNTER — Other Ambulatory Visit: Payer: Self-pay

## 2019-04-06 ENCOUNTER — Emergency Department (HOSPITAL_COMMUNITY): Payer: BC Managed Care – PPO

## 2019-04-06 ENCOUNTER — Emergency Department (HOSPITAL_COMMUNITY)
Admission: EM | Admit: 2019-04-06 | Discharge: 2019-04-06 | Disposition: A | Discharge status: Home / Self Care | Payer: BC Managed Care – PPO | Attending: Emergency Medicine | Admitting: Emergency Medicine

## 2019-04-06 DIAGNOSIS — Z79899 Other long term (current) drug therapy: Secondary | ICD-10-CM | POA: Diagnosis not present

## 2019-04-06 DIAGNOSIS — J45909 Unspecified asthma, uncomplicated: Secondary | ICD-10-CM | POA: Insufficient documentation

## 2019-04-06 DIAGNOSIS — F17200 Nicotine dependence, unspecified, uncomplicated: Secondary | ICD-10-CM | POA: Insufficient documentation

## 2019-04-06 DIAGNOSIS — R0789 Other chest pain: Secondary | ICD-10-CM | POA: Diagnosis not present

## 2019-04-06 DIAGNOSIS — R079 Chest pain, unspecified: Secondary | ICD-10-CM

## 2019-04-06 LAB — BASIC METABOLIC PANEL
Anion gap: 12 (ref 5–15)
BUN: 7 mg/dL (ref 6–20)
CO2: 27 mmol/L (ref 22–32)
Calcium: 9.4 mg/dL (ref 8.9–10.3)
Chloride: 98 mmol/L (ref 98–111)
Creatinine, Ser: 0.83 mg/dL (ref 0.61–1.24)
GFR calc Af Amer: >60 mL/min (ref >60)
GFR calc non Af Amer: >60 mL/min (ref >60)
Glucose, Bld: 106 mg/dL — ABNORMAL HIGH (ref 70–99)
Potassium: 3.4 mmol/L — ABNORMAL LOW (ref 3.5–5.1)
Sodium: 137 mmol/L (ref 135–145)

## 2019-04-06 LAB — CBC
HCT: 44.3 % (ref 39.0–52.0)
Hemoglobin: 15 g/dL (ref 13.0–17.0)
MCH: 29.4 pg (ref 26.0–34.0)
MCHC: 33.9 g/dL (ref 30.0–36.0)
MCV: 86.9 fL (ref 80.0–100.0)
Platelets: 242 10*3/uL (ref 150–400)
RBC: 5.1 MIL/uL (ref 4.22–5.81)
RDW: 12.5 % (ref 11.5–15.5)
WBC: 7.5 10*3/uL (ref 4.0–10.5)
nRBC: 0 % (ref 0.0–0.2)

## 2019-04-06 LAB — TROPONIN I (HIGH SENSITIVITY)
Troponin I (High Sensitivity): 73 ng/L — ABNORMAL HIGH (ref <18)
Troponin I (High Sensitivity): 75 ng/L — ABNORMAL HIGH (ref <18)

## 2019-04-06 MED ORDER — IOHEXOL 350 MG/ML SOLN
75.0000 mL | Freq: Once | INTRAVENOUS | Status: AC | PRN
  Administered 2019-04-06: 75 mL via INTRAVENOUS

## 2019-04-06 MED ORDER — SODIUM CHLORIDE 0.9% FLUSH: 3.0000 mL | Freq: Once | INTRAVENOUS | Status: DC

## 2019-04-06 NOTE — ED Notes (Signed)
Sign out pad not used. Pt. Verbalized understanding of discharge instructions.

## 2019-04-06 NOTE — ED Triage Notes (Signed)
Pt here for central/L sided chest pain x 1 week. Worsening over the weekend. Pain is the same with rest and exertion. Endorses shob. Sts he's had this problem before with less severity, but never received a dx. Occasional smoker.

## 2019-04-06 NOTE — ED Provider Notes (Signed)
Dave Hernandez   CSN: [id number] Arrival date & time: 04/06/19  1515     History Chief Complaint  Patient presents with  . Chest Pain    Dave Hernandez is a 24 y.o. male.  10 y who presents with substernal chest pain and heaviness for a week.  Symptoms worse over the weekend.  Symptoms at rest.  It is not worse exertion.  No radiation.  Did a recent work out.  Decrease exercise tolerance over the past 2-3 years.  Patient denies any recent cocaine use or other drug use.  Patient did smoke marijuana approximately 2 weeks ago.  The chest pain is constant but waxes and wanes.   Mother died of heart attack at 61.  Father recent had heart attack.    The history is provided by the patient. No language interpreter was used.  Chest Pain Pain location:  Substernal area Pain quality: aching and pressure   Pain quality: not radiating   Pain radiates to:  Does not radiate Pain severity:  Mild Onset quality:  Sudden Duration:  5 days Timing:  Constant Progression:  Worsening Chronicity:  New Context: not breathing, not raising an arm, not at rest and not trauma   Relieved by:  None tried Ineffective treatments:  None tried Associated symptoms: no abdominal pain, no altered mental status, no anxiety, no cough, no fever and no palpitations   Risk factors: male sex   Risk factors: no coronary artery disease, not obese and no smoking     HPI: A 24 year old patient presents for evaluation of chest pain. Initial onset of pain was less than one hour ago. The patient's chest pain is described as heaviness/pressure/tightness and is not worse with exertion. The patient's chest pain is middle- or left-sided, is not well-localized, is not sharp and does not radiate to the arms/jaw/neck. The patient does not complain of nausea and denies diaphoresis. The patient has smoked in the past 90 days. The patient has no history of stroke, has no history of  peripheral artery disease, denies any history of treated diabetes, has no relevant family history of coronary artery disease (first degree relative at less than age 79), is not hypertensive, has no history of hypercholesterolemia and does not have an elevated BMI (>=30).   Past Medical History:  Diagnosis Date  . Anxiety   . Depression     Patient Active Problem List   Diagnosis Date Noted  . Depression with anxiety 07/19/2016  . Uncomplicated asthma 40/98/1191  . Shortness of breath 07/19/2016    No past surgical history on file.     No family history on file.  Social History   Tobacco Use  . Smoking status: Current Every Day Smoker  . Smokeless tobacco: Never Used  Substance Use Topics  . Alcohol use: Yes  . Drug use: Yes    Types: Marijuana    Home Medications Prior to Admission medications   Medication Sig Start Date End Date Taking? Authorizing Provider  albuterol (PROVENTIL HFA;VENTOLIN HFA) 108 (90 Base) MCG/ACT inhaler Inhale 1 puff into the lungs every 6 (six) hours as needed for wheezing or shortness of breath.    [provider]  budesonide-formoterol (SYMBICORT) 80-4.5 MCG/ACT inhaler Inhale 2 puffs into the lungs 2 (two) times daily. 07/19/16   Scot Jun, FNP  calcium carbonate (TUMS - DOSED IN MG ELEMENTAL CALCIUM) 500 MG chewable tablet Chew 1 tablet by mouth as needed for indigestion or  heartburn.    [provider]  cetirizine (ZYRTEC) 10 MG tablet Take 1 tablet (10 mg total) by mouth daily. 07/19/16   Bing Neighbors, FNP  ibuprofen (ADVIL,MOTRIN) 200 MG tablet Take 600 mg by mouth every 6 (six) hours as needed for headache or moderate pain.    [provider]  montelukast (SINGULAIR) 10 MG tablet Take 1 tablet (10 mg total) by mouth at bedtime. 07/19/16   Bing Neighbors, FNP  venlafaxine XR (EFFEXOR XR) 150 MG 24 hr capsule Take 1 capsule (150 mg total) by mouth daily with breakfast. 07/19/16   Bing Neighbors, FNP     Allergies    Penicillins  Review of Systems   Review of Systems  Constitutional: Negative for fever.  Respiratory: Negative for cough.   Cardiovascular: Positive for chest pain. Negative for palpitations.  Gastrointestinal: Negative for abdominal pain.  All other systems reviewed and are negative.   Physical Exam Updated Vital Signs BP 129/76   Pulse 77   Temp 98 F (36.7 C) (Temporal)   Resp 20   SpO2 96%   Physical Exam Vitals and nursing Hernandez reviewed.  Constitutional:      Appearance: He is well-developed.  HENT:     Head: Normocephalic.     Right Ear: External ear normal.     Left Ear: External ear normal.  Eyes:     Conjunctiva/sclera: Conjunctivae normal.  Cardiovascular:     Rate and Rhythm: Normal rate.     Heart sounds: Normal heart sounds.  Pulmonary:     Effort: Pulmonary effort is normal.     Breath sounds: Normal breath sounds.  Abdominal:     General: Bowel sounds are normal.     Palpations: Abdomen is soft.  Musculoskeletal:        General: Normal range of motion.     Cervical back: Normal range of motion and neck supple.  Skin:    General: Skin is warm and dry.  Neurological:     Mental Status: He is alert and oriented to person, place, and time.     ED Results / Procedures / Treatments   Labs (all labs ordered are listed, but only abnormal results are displayed) Labs Reviewed  BASIC METABOLIC PANEL - Abnormal; Notable for the following components:      Result Value   Potassium 3.4 (*)    Glucose, Bld 106 (*)    All other components within normal limits  TROPONIN I (HIGH SENSITIVITY) - Abnormal; Notable for the following components:   Troponin I (High Sensitivity) 73 (*)    All other components within normal limits  TROPONIN I (HIGH SENSITIVITY) - Abnormal; Notable for the following components:   Troponin I (High Sensitivity) 75 (*)    All other components within normal limits  CBC    EKG EKG  Interpretation  Date/Time:  Monday April 06 2019 15:40:49 EST Ventricular Rate:  84 PR Interval:  128 QRS Duration: 96 QT Interval:  366 QTC Calculation: 432 R Axis:   104 Text Interpretation: Normal sinus rhythm with sinus arrhythmia Rightward axis T wave abnormality, consider inferior ischemia Abnormal ECG s1q1t3 pattern. no stemi,  no delta Confirmed by Tonette Lederer MD, Tenny Craw [id number]) on 04/06/2019 5:18:58 PM   Radiology DG Chest 2 View  Result Date: 04/06/2019 CLINICAL DATA:  Chest pain. EXAM: CHEST - 2 VIEW COMPARISON:  Jul 17, 2016 FINDINGS: The heart size and mediastinal contours are within normal limits. Both lungs are clear. The  visualized skeletal structures are unremarkable. IMPRESSION: No active cardiopulmonary disease. Electronically Signed   By: Aram Candela M.D.   On: 04/06/2019 15:55   CT Angio Chest PE W and/or Wo Contrast  Result Date: 04/06/2019 CLINICAL DATA:  Shortness of breath EXAM: CT ANGIOGRAPHY CHEST WITH CONTRAST TECHNIQUE: Multidetector CT imaging of the chest was performed using the standard protocol during bolus administration of intravenous contrast. Multiplanar CT image reconstructions and MIPs were obtained to evaluate the vascular anatomy. CONTRAST:  59mL OMNIPAQUE IOHEXOL 350 MG/ML SOLN COMPARISON:  None. FINDINGS: Cardiovascular: Evaluation for pulmonary emboli is limited by suboptimal contrast bolus timing. In addition, there is respiratory motion artifact at the lung bases which also limits evaluation.There is no pulmonary embolus. The main pulmonary artery is within normal limits for size. There is no CT evidence of acute right heart strain. The visualized aorta is normal. Heart size is normal, without pericardial effusion. Mediastinum/Nodes: --No mediastinal or hilar lymphadenopathy. --No axillary lymphadenopathy. --No supraclavicular lymphadenopathy. --Normal thyroid gland. --The esophagus is unremarkable Lungs/Pleura: No pulmonary nodules or masses. No  pleural effusion or pneumothorax. No focal airspace consolidation. No focal pleural abnormality. Upper Abdomen: No acute abnormality. Musculoskeletal: No chest wall abnormality. No acute or significant osseous findings. Bilateral gynecomastia is noted. Review of the MIP images confirms the above findings. IMPRESSION: 1. Evaluation for pulmonary emboli is limited as detailed above. Given these limitations, no PE was identified. 2. No acute findings in the chest. Electronically Signed   By: Katherine Mantle M.D.   On: 04/06/2019 19:44    Procedures Procedures (including critical care time)  Medications Ordered in ED Medications  sodium chloride flush (NS) 0.9 % injection 3 mL (has no administration in time range)  iohexol (OMNIPAQUE) 350 MG/ML injection 75 mL (75 mLs Intravenous Contrast Given 04/06/19 1920)    ED Course  I have reviewed the triage vital signs and the nursing notes.  Pertinent labs & imaging results that were available during my care of the patient were reviewed by me and considered in my medical decision making (see chart for details).    MDM Rules/Calculators/A&P HEAR Score: 2                    24 year old who presents for central/left-sided chest pressure for the past week or so.  Will obtain troponin, CMP, CBC to look for anemia, signs of acute coronary disease, EKG to evaluate for any arrhythmia.  Will obtain chest x-ray.  EKG shows s1q3t3, with some mild right heart strain.  We will proceed and obtain CT PE.  Troponin is elevated to 73.  No signs of ischemia on EKG.  Will repeat troponin.  Chest x-ray visualized by me, no signs of fluid overload or pneumonia.  No signs of enlarged heart.  CT PE visualized by me, no signs of any embolism.  Normal heart and lungs noted.  EKG is flat with a repeat troponin of 75.  Discussed case with Dr. Rhunette Croft, who was able to evaluate patient.  Patient continues to feel better and is more relieved after knowing that his CT was  normal.  There is likely some anxiety component to this.  Nevertheless the patient continues to have slightly elevated troponin.  Will have patient follow-up with cardiology later this week.  I have sent a message to Salley Hews to help arrange.    Patient can be reached at [phone].  Discussed that the patient needs to return for worsening chest pains or other concerns.  Final Clinical Impression(s) / ED Diagnoses Final diagnoses:  Nonspecific chest pain    Rx / DC Orders ED Discharge Orders    None       Niel Hummer, MD 04/07/19 [phone]

## 2019-04-09 ENCOUNTER — Telehealth: Payer: Self-pay | Admitting: Cardiology

## 2019-04-09 ENCOUNTER — Encounter: Payer: Self-pay | Admitting: Cardiology

## 2019-04-09 ENCOUNTER — Ambulatory Visit (INDEPENDENT_AMBULATORY_CARE_PROVIDER_SITE_OTHER): Payer: BC Managed Care – PPO | Admitting: Cardiology

## 2019-04-09 ENCOUNTER — Other Ambulatory Visit: Payer: Self-pay

## 2019-04-09 VITALS — BP 160/85 | HR 75 | Temp 97.0°F | Ht 75.0 in | Wt 194.0 lb

## 2019-04-09 DIAGNOSIS — R030 Elevated blood-pressure reading, without diagnosis of hypertension: Secondary | ICD-10-CM

## 2019-04-09 DIAGNOSIS — R0602 Shortness of breath: Secondary | ICD-10-CM | POA: Diagnosis not present

## 2019-04-09 DIAGNOSIS — R002 Palpitations: Secondary | ICD-10-CM

## 2019-04-09 DIAGNOSIS — I401 Isolated myocarditis: Secondary | ICD-10-CM

## 2019-04-09 DIAGNOSIS — R072 Precordial pain: Secondary | ICD-10-CM

## 2019-04-09 NOTE — Patient Instructions (Signed)
Medication Instructions:  Your physician recommends that you continue on your current medications as directed. Please refer to the Current Medication list given to you today. *If you need a refill on your cardiac medications before your next appointment, please call your pharmacy*  Lab Work: Your physician recommends that you return for lab work in: SED RATE, C-REACTION, BMET If you have labs (blood work) drawn today and your tests are completely normal, you will receive your results only by: Marland Kitchen MyChart Message (if you have MyChart) OR . A paper copy in the mail If you have any lab test that is abnormal or we need to change your treatment, we will call you to review the results.  Testing/Procedures: CARDIAC MRI   Follow-Up: At [facility], you and your health needs are our priority.  As part of our continuing mission to provide you with exceptional heart care, we have created designated Provider Care Teams.  These Care Teams include your primary Cardiologist (physician) and Advanced Practice Providers (APPs -  Physician Assistants and Nurse Practitioners) who all work together to provide you with the care you need, when you need it.  Your next appointment:   4 week(s)  The format for your next appointment:   Virtual Visit   Provider:   Jodelle Red, MD  Other Instructions

## 2019-04-09 NOTE — Telephone Encounter (Signed)
Pt c/o of Chest Pain: STAT if CP now or developed within 24 hours  1. Are you having CP right now? No  2. Are you experiencing any other symptoms (ex. SOB, nausea, vomiting, sweating)? No  3. How long have you been experiencing CP? 1 week  4. Is your CP continuous or coming and going? Coming and going  5. Have you taken Nitroglycerin? No  Please call [phone] ?

## 2019-04-09 NOTE — Telephone Encounter (Signed)
Left message to call back  Patient has never been seen by our practice Appointment with Dr Cristal Deer can be moved to opening with her today

## 2019-04-09 NOTE — Telephone Encounter (Signed)
Spoke with patient and he is not currently having chest pains. Did move is new patient appointment to today with Dr Cristal Deer Patient aware of time and location

## 2019-04-09 NOTE — Progress Notes (Signed)
Cardiology Office Note:    Date:  04/09/2019   ID:  Dave Hernandez, DOB 1995/03/31, MRN [id number]  PCP:  Scot Jun, FNP  Cardiologist:  Buford Dresser, MD  Referring MD: Scot Jun, FNP   Chief Complaint  Patient presents with  . Chest Pain    History of Present Illness:    Dave Hernandez is a 24 y.o. male without significant PMH who is seen as a new consult at the request of Scot Jun, FNP for the evaluation and management of chest pain.  HPI: Use to be very active, play basketball, etc. Now feels winded, cannot do the activity he used to do. Feels occasional hard heartbeats. Any stresses such as heat, exertion, etc can trigger this. Hadn't had chest pain until recently.  Around 04/01/19 started having pressure in his chest along with shortness of breath. Wasn't sure of the cause. Had been doing home remodeling with his dad, thought maybe it was mold exposure, etc. Symptoms continued to progress. Nonexertional, would wax and wane but constant, continued to feel tight in his chest. Is an ache/pressure.    On 1/18 had a very sharp, sudden episode that felt like a knife. Went to ER. Had CTPE, negative. Troponins elevated 73 ->75.   No further sharp pain but constant tightness has continued. No fevers, chills, night sweats. No cough. No known Covid exposures but has not been tested. Has been very limited in exposure to other people. No loss of taste or smell.   No routine nicotine, does smoke occasional marijuana. None since symptoms started.   ROS positive for intermittent headaches, ringing in his ears (does shoot guns, loud noises), intermittent skin boils in groin area.  Father recently had MI, had issues with control of diabetes. Just got home from the hospital after MI. Has been very stressed.  Past Medical History:  Diagnosis Date  . Anxiety   . Depression     History reviewed. No pertinent surgical history.  Current Medications: No  current outpatient medications on file prior to visit.   No current facility-administered medications on file prior to visit.     Allergies:   Penicillins   Social History   Tobacco Use  . Smoking status: Current Every Day Smoker  . Smokeless tobacco: Never Used  Substance Use Topics  . Alcohol use: Yes  . Drug use: Yes    Types: Marijuana    Family History: Father just left hospital for MI.   ROS:   Please see the history of present illness.  Additional pertinent ROS: Constitutional: Negative for chills, fever, night sweats, unintentional weight loss  HENT: Negative for ear pain and hearing loss.   Eyes: Negative for loss of vision and eye pain.  Respiratory: Negative for cough, sputum, wheezing.   Cardiovascular: See HPI. Gastrointestinal: Negative for abdominal pain, melena, and hematochezia.  Genitourinary: Negative for dysuria and hematuria.  Musculoskeletal: Negative for falls and myalgias.  Skin: Negative for itching and rash.  Neurological: Negative for focal weakness, focal sensory changes and loss of consciousness.  Endo/Heme/Allergies: Does not bruise/bleed easily.     EKGs/Labs/Other Studies Reviewed:    The following studies were reviewed today: No prior cardiac studies. CTPE reviewed.  EKG:  EKG is personally reviewed.  The ekg ordered 04/06/19 demonstrates SR with sinus arrhythmia, S1Q3T3 pattern  Recent Labs: 04/06/2019: BUN 7; Creatinine, Ser 0.83; Hemoglobin 15.0; Platelets 242; Potassium 3.4; Sodium 137  Recent Lipid Panel No results found for: CHOL, TRIG,  HDL, CHOLHDL, VLDL, LDLCALC, LDLDIRECT  Physical Exam:    VS:  BP (!) 160/85   Pulse 75   Temp (!) 97 F (36.1 C)   Ht _0  (1.905 m)   Wt 194 lb (88 kg)   SpO2 97%   BMI 24.25 kg/m     Wt Readings from Last 3 Encounters:  04/09/19 194 lb (88 kg)  08/09/16 184 lb (83.5 kg)  07/19/16 172 lb (78 kg)    GEN: Well nourished, well developed in no acute distress HEENT: Normal, moist  mucous membranes NECK: No JVD CARDIAC: regular rhythm, normal S1 and S2, no rubs or gallops. No murmurs. VASCULAR: Radial and DP pulses 2+ bilaterally. No carotid bruits RESPIRATORY:  Clear to auscultation without rales, wheezing or rhonchi  ABDOMEN: Soft, non-tender, non-distended MUSCULOSKELETAL:  Ambulates independently SKIN: Warm and dry, no edema NEUROLOGIC:  Alert and oriented x 3. No focal neuro deficits noted. PSYCHIATRIC:  Normal affect    ASSESSMENT:    1. Subacute idiopathic myocarditis   2. Shortness of breath   3. Precordial pain   4. Palpitations   5. Elevated blood pressure reading    PLAN:    Shortness of breath, precordial pain, palpitations: recent ER visit with flat but elevated troponins. This is abnormal. My concern is for a mild myocarditis picture given symptoms and troponins -negative CTPE in ER -will order ESR, CRP -most diagnostic test is cardiac MRI. Will show function, scar, inflammation; better choice than echo alone given his elevated troponins. BMET for cMRI as he will need contrast (recently normal but received contrast dye) -does not have typical symptoms, but given his age, may be atypical Covid presentation. Will get Covid test, counseled on quarantine timing dependent on test results -he is limited in activity currently given his symptoms. No heavy exercise until MRI results return. -no rub on exam today, appears euvolemic and well perfused.  Elevated blood pressure reading: he is very nervous about this visit, historically has not been elevated. Monitor.  Plan for follow up: 4 weeks virtual or sooner based on test results  Medication Adjustments/Labs and Tests Ordered: Current medicines are reviewed at length with the patient today.  Concerns regarding medicines are outlined above.  Orders Placed This Encounter  Procedures  . MR Card Morphology Wo/W Cm  . Sed Rate (ESR)  . C-reactive protein  . Basic Metabolic Panel (BMET)   No orders of  the defined types were placed in this encounter.   Patient Instructions  Medication Instructions:  Your physician recommends that you continue on your current medications as directed. Please refer to the Current Medication list given to you today. *If you need a refill on your cardiac medications before your next appointment, please call your pharmacy*  Lab Work: Your physician recommends that you return for lab work in: [place], C-REACTION, BMET If you have labs (blood work) drawn today and your tests are completely normal, you will receive your results only by: Marland Kitchen MyChart Message (if you have MyChart) OR . A paper copy in the mail If you have any lab test that is abnormal or we need to change your treatment, we will call you to review the results.  Testing/Procedures: CARDIAC MRI   Follow-Up: At [facility], you and your health needs are our priority.  As part of our continuing mission to provide you with exceptional heart care, we have created designated Provider Care Teams.  These Care Teams include your primary Cardiologist (physician) and Advanced  Practice Providers (APPs -  Physician Assistants and Nurse Practitioners) who all work together to provide you with the care you need, when you need it.  Your next appointment:   4 week(s)  The format for your next appointment:   Virtual Visit   Provider:   Buford Dresser, MD  Other Instructions    Signed, Buford Dresser, MD PhD 04/09/2019 6:53 PM    [place]

## 2019-04-10 LAB — BASIC METABOLIC PANEL
BUN/Creatinine Ratio: 11 (ref 9–20)
BUN: 10 mg/dL (ref 6–20)
CO2: 25 mmol/L (ref 20–29)
Calcium: 9.7 mg/dL (ref 8.7–10.2)
Chloride: 102 mmol/L (ref 96–106)
Creatinine, Ser: 0.92 mg/dL (ref 0.76–1.27)
GFR calc Af Amer: 135 mL/min/{1.73_m2} (ref >59)
GFR calc non Af Amer: 117 mL/min/{1.73_m2} (ref >59)
Glucose: 100 mg/dL — ABNORMAL HIGH (ref 65–99)
Potassium: 4.3 mmol/L (ref 3.5–5.2)
Sodium: 141 mmol/L (ref 134–144)

## 2019-04-10 LAB — C-REACTIVE PROTEIN: CRP: <1 mg/L (ref 0–10)

## 2019-04-10 LAB — SEDIMENTATION RATE: Sed Rate: 4 mm/hr (ref 0–15)

## 2019-04-14 ENCOUNTER — Other Ambulatory Visit: Payer: BC Managed Care – PPO

## 2019-04-16 ENCOUNTER — Other Ambulatory Visit: Payer: BC Managed Care – PPO

## 2019-04-16 ENCOUNTER — Ambulatory Visit: Payer: BLUE CROSS/BLUE SHIELD | Admitting: Cardiology

## 2019-04-17 ENCOUNTER — Encounter: Payer: Self-pay | Admitting: Cardiology

## 2019-04-17 ENCOUNTER — Telehealth: Payer: Self-pay | Admitting: Cardiology

## 2019-04-17 NOTE — Telephone Encounter (Signed)
Spoke with patient regarding cardiac mri scheduled Monday 05/04/19 at 8:00 am----arrival time 7:15 am at [facility] 1st floor admissions office ---will mail information to patient

## 2019-05-01 ENCOUNTER — Telehealth (HOSPITAL_COMMUNITY): Payer: Self-pay | Admitting: Emergency Medicine

## 2019-05-01 NOTE — Telephone Encounter (Signed)
Left message on voicemail with name and callback number Viraaj Vorndran RN Navigator Cardiac Imaging [facility] [phone] Office [phone] Cell  

## 2019-05-04 ENCOUNTER — Ambulatory Visit (HOSPITAL_COMMUNITY)
Admission: RE | Admit: 2019-05-04 | Discharge: 2019-05-04 | Disposition: A | Discharge status: Home / Self Care | Payer: BC Managed Care – PPO | Source: Ambulatory Visit | Attending: Cardiology | Admitting: Cardiology

## 2019-05-04 ENCOUNTER — Other Ambulatory Visit: Payer: Self-pay

## 2019-05-04 DIAGNOSIS — R0602 Shortness of breath: Secondary | ICD-10-CM

## 2019-05-04 DIAGNOSIS — I401 Isolated myocarditis: Secondary | ICD-10-CM | POA: Diagnosis not present

## 2019-05-04 DIAGNOSIS — R072 Precordial pain: Secondary | ICD-10-CM | POA: Diagnosis not present

## 2019-05-04 MED ORDER — GADOBUTROL 1 MMOL/ML IV SOLN
10.0000 mL | Freq: Once | INTRAVENOUS | Status: AC | PRN
  Administered 2019-05-04: 10:00:00 10 mL via INTRAVENOUS

## 2019-05-05 ENCOUNTER — Telehealth: Payer: Self-pay | Admitting: Cardiology

## 2019-05-05 NOTE — Telephone Encounter (Signed)
Pt updated and verbalized understanding.  

## 2019-05-05 NOTE — Telephone Encounter (Signed)
Patient returned call for MRI results. °

## 2019-05-06 ENCOUNTER — Telehealth (INDEPENDENT_AMBULATORY_CARE_PROVIDER_SITE_OTHER): Payer: BC Managed Care – PPO | Admitting: Cardiology

## 2019-05-06 ENCOUNTER — Encounter: Payer: Self-pay | Admitting: Cardiology

## 2019-05-06 DIAGNOSIS — R0602 Shortness of breath: Secondary | ICD-10-CM | POA: Diagnosis not present

## 2019-05-06 DIAGNOSIS — Z712 Person consulting for explanation of examination or test findings: Secondary | ICD-10-CM | POA: Diagnosis not present

## 2019-05-06 DIAGNOSIS — R072 Precordial pain: Secondary | ICD-10-CM | POA: Diagnosis not present

## 2019-05-06 NOTE — Progress Notes (Signed)
Virtual Visit via Telephone Note   This visit type was conducted due to national recommendations for restrictions regarding the COVID-19 Pandemic (e.g. social distancing) in an effort to limit this patient's exposure and mitigate transmission in our community.  Due to his co-morbid illnesses, this patient is at least at moderate risk for complications without adequate follow up.  This format is felt to be most appropriate for this patient at this time.  The patient did not have access to video technology/had technical difficulties with video requiring transitioning to audio format only (telephone).  All issues noted in this document were discussed and addressed.  No physical exam could be performed with this format.  Please refer to the patient's chart for his  consent to telehealth for [facility].   Date:  05/06/2019   ID:  Dave Hernandez, DOB December 17, 1995, MRN [id number]  Patient Location: Home Provider Location: Home  PCP:  Bing Neighbors, FNP  Cardiologist:  Jodelle Red, MD  Electrophysiologist:  None   Evaluation Performed:  Follow-Up Visit  Chief Complaint:  Follow up  History of Present Illness:    Dave Hernandez is a 24 y.o. male with PMH chest pain/shortness of breath.  The patient does not have symptoms concerning for COVID-19 infection (fever, chills, cough, or new shortness of breath).   Today: Feeling better. No chest heaviness. Has occasional shortness of breath, wonders if he needs an inhaler. Works in [employer], has frequent exposures to [employer] as part of his job. Denies wheezing but does sometimes feels short of breath, occasional cough. Discussed trial of allergy medication and/or inhaler. Recommended he follow up with his PCP for this.  Reviewed cardiac MRI today. Very reassuring. No evidence of inflammation or scar.  We discussed today that it is unclear what his event was. It is abnormal for troponins to be elevated,  but there is no evidence of myocarditis on MRI.   Recommended gradual increase in activity level. Discussed symptoms to monitor for.  Denies chest pain, shortness of breath at rest or with normal exertion. No PND, orthopnea, LE edema or unexpected weight gain. No syncope or palpitations.   Past Medical History:  Diagnosis Date  . Anxiety   . Depression    No past surgical history on file.   No outpatient medications have been marked as taking for the 05/06/19 encounter (Telemedicine) with Jodelle Red, MD.     Allergies:   Penicillins   Social History   Tobacco Use  . Smoking status: Current Every Day Smoker  . Smokeless tobacco: Never Used  Substance Use Topics  . Alcohol use: Yes  . Drug use: Yes    Types: Marijuana     Family Hx: Father has CAD, recent MI.  ROS:   Please see the history of present illness.    All other systems reviewed and are negative.   Prior CV studies:   The following studies were reviewed today: cMRI 05/04/19 FINDINGS: Limited images of the lung fields showed no gross abnormalities.  Normal left ventricular size with normal wall thickness. Normal wall motion, overall LV EF 58%. No increased T2 signal noted. Normal right ventricular size and systolic function, EF 48%. Normal left and right atrial sizes. Trileaflet aortic valve with no significant stenosis or regurgitation. No significant mitral regurgitation.  Difficult delayed enhancement images, but no definite late gadolinium enhancement (LGE) noted.  Measurements:  LVEDV 253 mL  LVSV 146 mL LVEF 58%  RVEDV 277 mL RVSV 133  mL RVEF 48%  ECV 25% septum, 22% lateral wall  IMPRESSION: 1.  Normal LV size with EF 58%.  Normal wall motion.  2.  Normal RV size with EF 48%.  3. No myocardial LGE, so no definitive evidence for prior MI, myocarditis, or infiltrative disease.  4.  T2 signal and ECV not suggestive of myocarditis.  No evidence for  myocarditis, normal study.  Labs/Other Tests and Data Reviewed:    EKG:  EKG is personally reviewed.  The ekg ordered 04/06/19 demonstrates SR with sinus arrhythmia, S1Q3T3 pattern  Recent Labs: 04/06/2019: Hemoglobin 15.0; Platelets 242 04/09/2019: BUN 10; Creatinine, Ser 0.92; Potassium 4.3; Sodium 141   Recent Lipid Panel No results found for: CHOL, TRIG, HDL, CHOLHDL, LDLCALC, LDLDIRECT  Wt Readings from Last 3 Encounters:  04/09/19 194 lb (88 kg)  08/09/16 184 lb (83.5 kg)  07/19/16 172 lb (78 kg)     Objective:    Vital Signs:  There were no vitals taken for this visit.   Speaking comfortably on the phone, no audible wheezing In no acute distress Alert and oriented Normal affect Normal speech  ASSESSMENT & PLAN:    Shortness of breath: no clear cardiac etiology. Has reported history of asthma. Has exposures with his job -recommend discussing OTC allergy medications vs. Montelukast and/or rescue inhaler to see if this helps. He will discuss with his PCP -no cardiac etiology found for his symptoms  Prior episode of precordial pain, elevated troponins: no recurrence -reviewed results of cardiac MRI today, which is very reassuring -no elevation in inflammatory markers -no evidence of myocarditis. Unclear etiology for elevation in troponin -counseled on red flag warning signs that need immediate medical attention.  COVID-19 Education: The signs and symptoms of COVID-19 were discussed with the patient and how to seek care for testing (follow up with PCP or arrange E-visit).  The importance of social distancing was discussed today.  Time:   Today, I have spent 10 minutes with the patient with telehealth technology discussing the above problems.    Patient Instructions  Medication Instructions:  Your Physician recommend you continue on your current medication as directed.    *If you need a refill on your cardiac medications before your next appointment, please call your  pharmacy*  Lab Work: None  Testing/Procedures: None  Follow-Up: At [facility], you and your health needs are our priority.  As part of our continuing mission to provide you with exceptional heart care, we have created designated Provider Care Teams.  These Care Teams include your primary Cardiologist (physician) and Advanced Practice Providers (APPs -  Physician Assistants and Nurse Practitioners) who all work together to provide you with the care you need, when you need it.  Your next appointment:   As needed  The format for your next appointment:   Either In Person or Virtual  Provider:   Buford Dresser, MD      Follow Up:  As needed  Signed, Buford Dresser, MD  05/06/2019  [place]

## 2019-05-06 NOTE — Patient Instructions (Signed)
Medication Instructions:  Your Physician recommend you continue on your current medication as directed.    *If you need a refill on your cardiac medications before your next appointment, please call your pharmacy*  Lab Work: None  Testing/Procedures: None  Follow-Up: At [facility], you and your health needs are our priority.  As part of our continuing mission to provide you with exceptional heart care, we have created designated Provider Care Teams.  These Care Teams include your primary Cardiologist (physician) and Advanced Practice Providers (APPs -  Physician Assistants and Nurse Practitioners) who all work together to provide you with the care you need, when you need it.  Your next appointment:   As needed   The format for your next appointment:   Either In Person or Virtual  Provider:   Bridgette Christopher, MD   

## 2020-07-30 IMAGING — CT CT ANGIO CHEST
2 of 7 series · 18 of 46 positions shown · IV contrast (omnipaque)
Comparison: None.

CLINICAL DATA: Shortness of breath

EXAM:
CT ANGIOGRAPHY CHEST WITH CONTRAST
TECHNIQUE: Multidetector CT imaging of the chest was performed using the
standard protocol during bolus administration of intravenous
contrast. Multiplanar CT image reconstructions and MIPs were
obtained to evaluate the vascular anatomy.
CONTRAST:  75mL OMNIPAQUE IOHEXOL 350 MG/ML SOLN

[Series 6: thins · axial · 0.78mm/px · z∈[+32,+293]mm · 15 of 295 slices shown]
[im 17/295  lung]
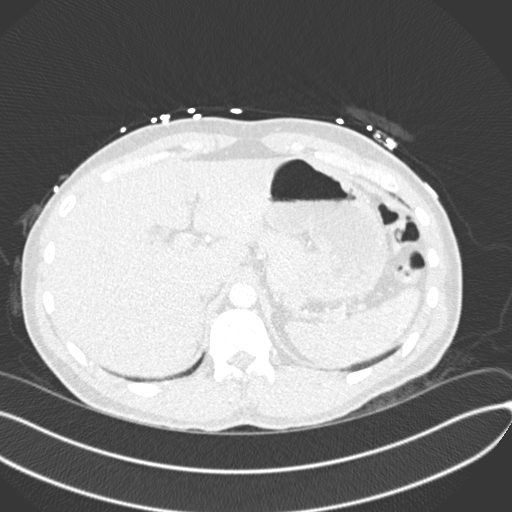
[im 33/295  soft-tissue]
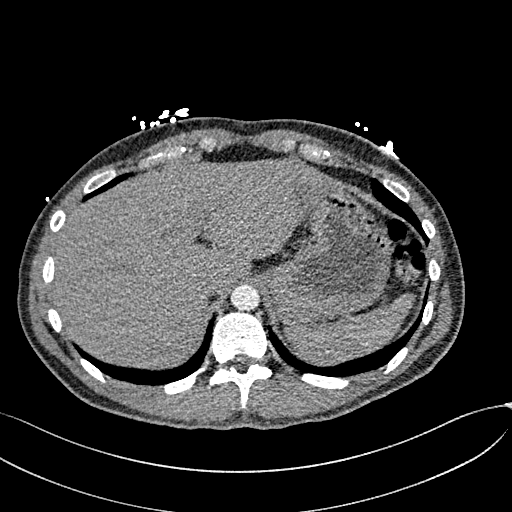
[im 50/295  lung]
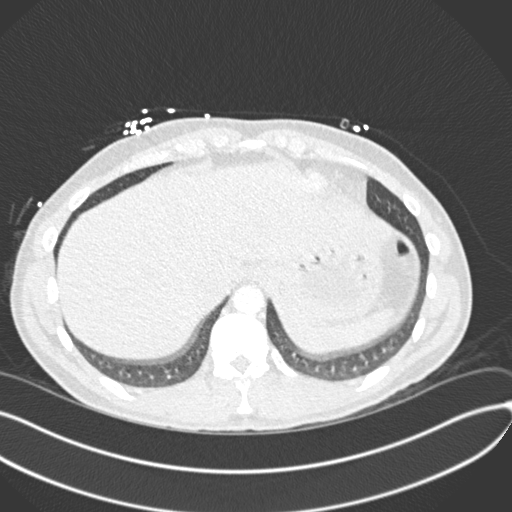
[im 66/295  soft-tissue]
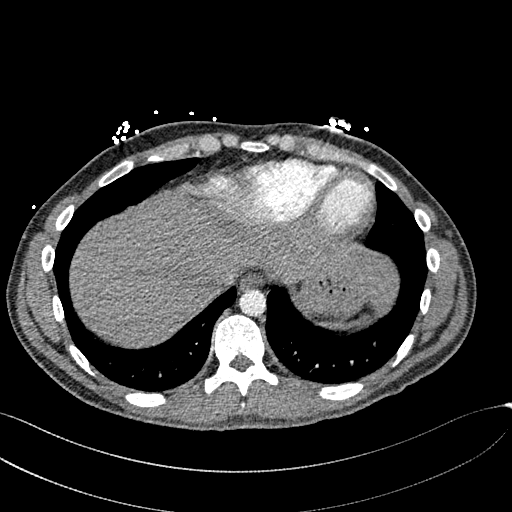
[im 99/295  lung]
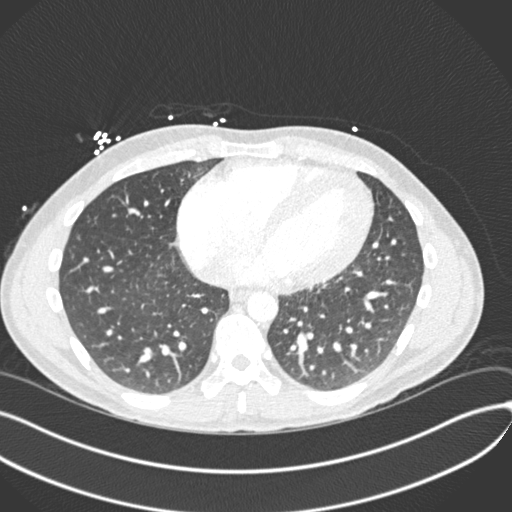
[im 115/295  soft-tissue]
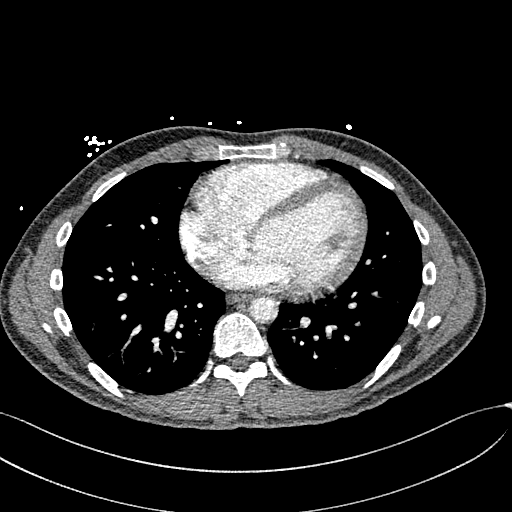
[im 131/295  lung]
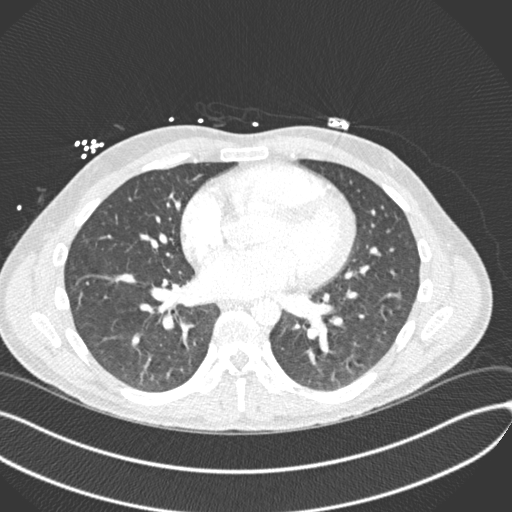
[im 148/295  soft-tissue]
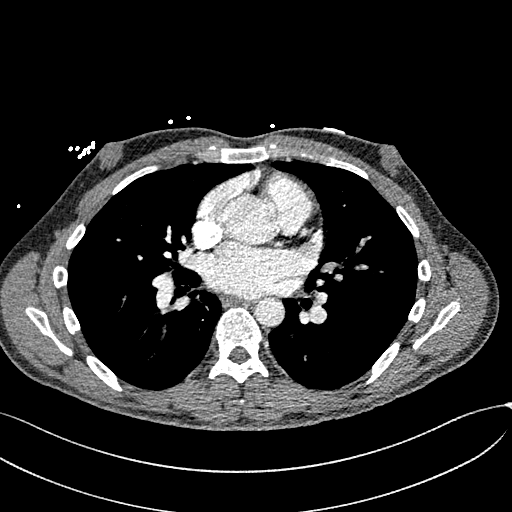
[im 164/295  lung]
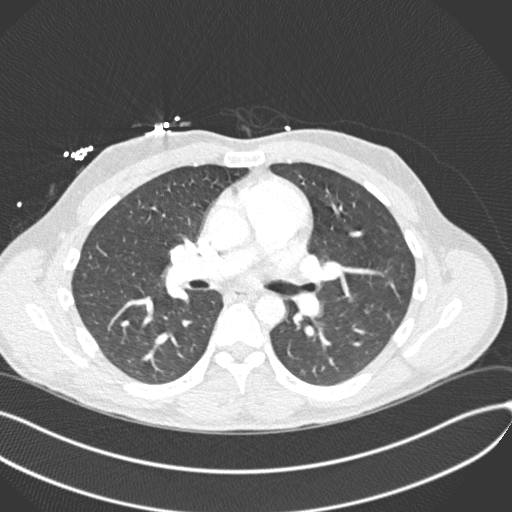
[im 180/295  soft-tissue]
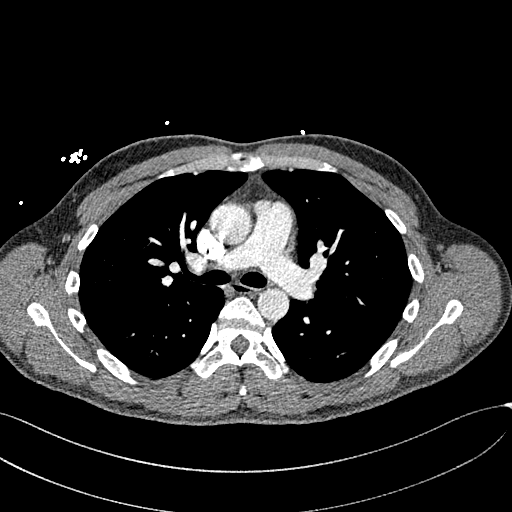
[im 197/295  lung]
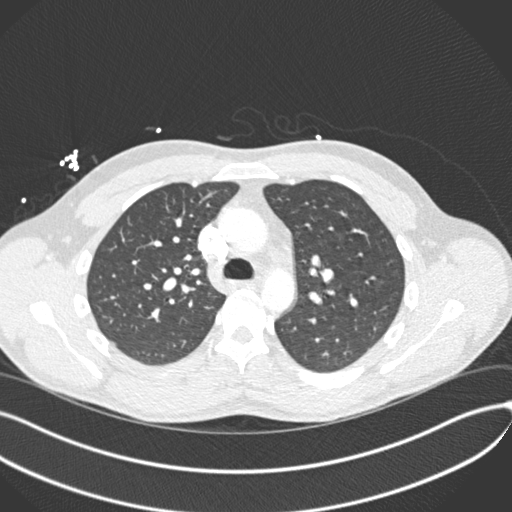
[im 229/295  soft-tissue]
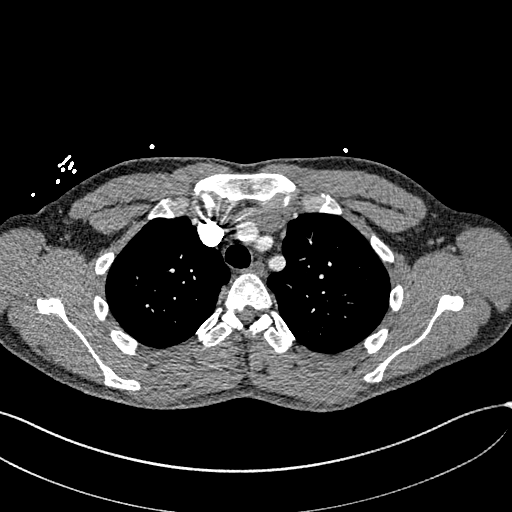
[im 245/295  lung]
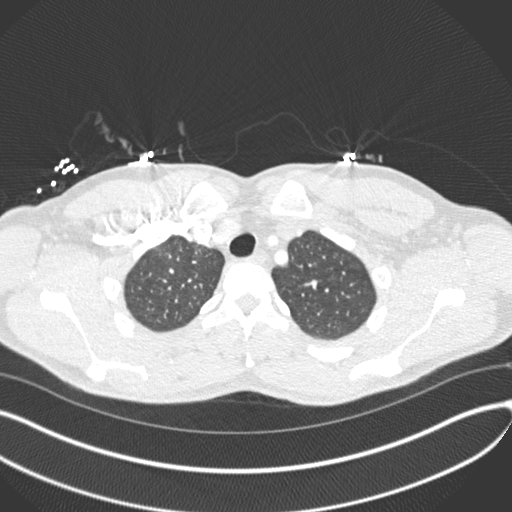
[im 262/295  soft-tissue]
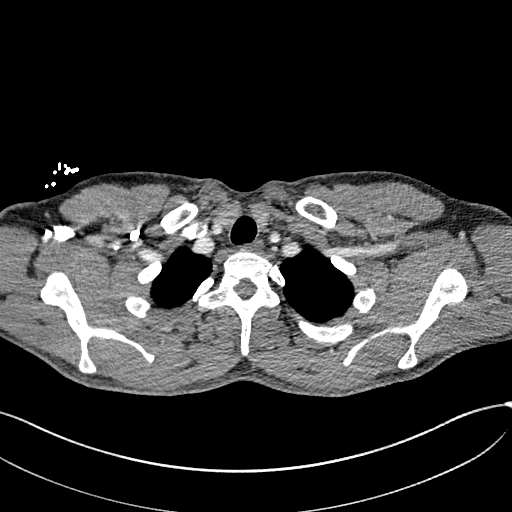
[im 278/295  lung]
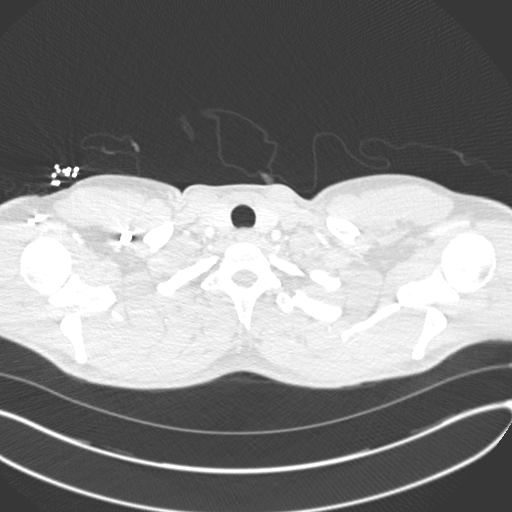

[Series 8: coronal mpr · coronal · 0.59mm/px · 3 of 149 slices shown]
[im 38/149  soft-tissue]
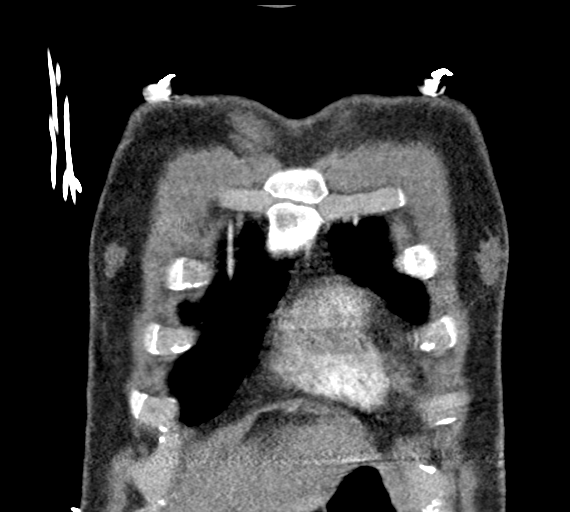
[im 75/149  soft-tissue]
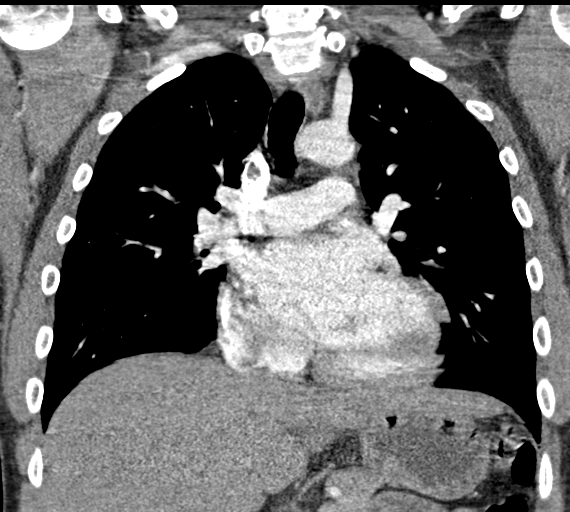
[im 112/149  soft-tissue]
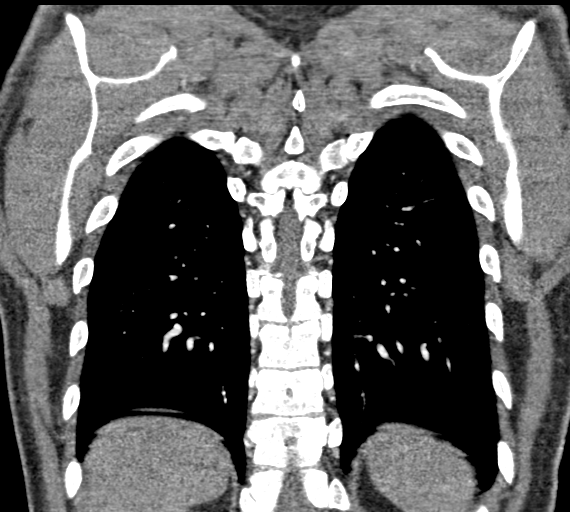

[18 of 46 positions shown; findings below may reference images not displayed]

FINDINGS: Cardiovascular: Evaluation for pulmonary emboli is limited by
suboptimal contrast bolus timing. In addition, there is respiratory
motion artifact at the lung bases which also limits evaluation.There
is no pulmonary embolus. The main pulmonary artery is within normal
limits for size. There is no CT evidence of acute right heart
strain. The visualized aorta is normal. Heart size is normal,
without pericardial effusion.

Mediastinum/Nodes:

--No mediastinal or hilar lymphadenopathy.

--No axillary lymphadenopathy.

--No supraclavicular lymphadenopathy.

--Normal thyroid gland.

--The esophagus is unremarkable

Lungs/Pleura: No pulmonary nodules or masses. No pleural effusion or
pneumothorax. No focal airspace consolidation. No focal pleural
abnormality.

Upper Abdomen: No acute abnormality.

Musculoskeletal: No chest wall abnormality. No acute or significant
osseous findings. Bilateral gynecomastia is noted.

Review of the MIP images confirms the above findings.
IMPRESSION: 1. Evaluation for pulmonary emboli is limited as detailed above.
Given these limitations, no PE was identified.
2. No acute findings in the chest.

## 2021-03-07 ENCOUNTER — Other Ambulatory Visit: Payer: Self-pay

## 2021-03-07 ENCOUNTER — Emergency Department (HOSPITAL_COMMUNITY)
Admission: EM | Admit: 2021-03-07 | Discharge: 2021-03-07 | Disposition: A | Discharge status: Home / Self Care | Payer: BC Managed Care – PPO | Attending: Emergency Medicine | Admitting: Emergency Medicine

## 2021-03-07 ENCOUNTER — Emergency Department (HOSPITAL_COMMUNITY): Payer: BC Managed Care – PPO

## 2021-03-07 DIAGNOSIS — N200 Calculus of kidney: Secondary | ICD-10-CM | POA: Diagnosis not present

## 2021-03-07 DIAGNOSIS — F17200 Nicotine dependence, unspecified, uncomplicated: Secondary | ICD-10-CM | POA: Insufficient documentation

## 2021-03-07 DIAGNOSIS — R109 Unspecified abdominal pain: Secondary | ICD-10-CM | POA: Diagnosis present

## 2021-03-07 DIAGNOSIS — R1114 Bilious vomiting: Secondary | ICD-10-CM

## 2021-03-07 LAB — CBC WITH DIFFERENTIAL/PLATELET
Abs Immature Granulocytes: 0.01 10*3/uL (ref 0.00–0.07)
Basophils Absolute: 0.1 10*3/uL (ref 0.0–0.1)
Basophils Relative: 1 %
Eosinophils Absolute: 0.2 10*3/uL (ref 0.0–0.5)
Eosinophils Relative: 2 %
HCT: 43.2 % (ref 39.0–52.0)
Hemoglobin: 15 g/dL (ref 13.0–17.0)
Immature Granulocytes: 0 %
Lymphocytes Relative: 40 %
Lymphs Abs: 3.3 10*3/uL (ref 0.7–4.0)
MCH: 30.2 pg (ref 26.0–34.0)
MCHC: 34.7 g/dL (ref 30.0–36.0)
MCV: 87.1 fL (ref 80.0–100.0)
Monocytes Absolute: 0.9 10*3/uL (ref 0.1–1.0)
Monocytes Relative: 10 %
Neutro Abs: 4 10*3/uL (ref 1.7–7.7)
Neutrophils Relative %: 47 %
Platelets: 264 10*3/uL (ref 150–400)
RBC: 4.96 MIL/uL (ref 4.22–5.81)
RDW: 12.3 % (ref 11.5–15.5)
WBC: 8.4 10*3/uL (ref 4.0–10.5)
nRBC: 0 % (ref 0.0–0.2)

## 2021-03-07 LAB — URINALYSIS, MICROSCOPIC (REFLEX): RBC / HPF: >50 RBC/hpf (ref 0–5)

## 2021-03-07 LAB — BASIC METABOLIC PANEL
Anion gap: 10 (ref 5–15)
BUN: 14 mg/dL (ref 6–20)
CO2: 24 mmol/L (ref 22–32)
Calcium: 9.1 mg/dL (ref 8.9–10.3)
Chloride: 102 mmol/L (ref 98–111)
Creatinine, Ser: 1.11 mg/dL (ref 0.61–1.24)
GFR, Estimated: >60 mL/min (ref >60)
Glucose, Bld: 141 mg/dL — ABNORMAL HIGH (ref 70–99)
Potassium: 3.4 mmol/L — ABNORMAL LOW (ref 3.5–5.1)
Sodium: 136 mmol/L (ref 135–145)

## 2021-03-07 LAB — URINALYSIS, ROUTINE W REFLEX MICROSCOPIC
Glucose, UA: NEGATIVE mg/dL
Ketones, ur: 15 mg/dL — AB
Leukocytes,Ua: NEGATIVE
Nitrite: NEGATIVE
Protein, ur: 100 mg/dL — AB
Specific Gravity, Urine: >1.03 — ABNORMAL HIGH (ref 1.005–1.030)
pH: 5.5 (ref 5.0–8.0)

## 2021-03-07 MED ORDER — KETOROLAC TROMETHAMINE 30 MG/ML IJ SOLN
15.0000 mg | Freq: Once | INTRAMUSCULAR | Status: AC
  Administered 2021-03-07: 12:00:00 15 mg via INTRAVENOUS
  Filled 2021-03-07: qty 1

## 2021-03-07 MED ORDER — ONDANSETRON 4 MG PO TBDP: 4.0000 mg | ORAL_TABLET | Freq: Three times a day (TID) | ORAL | 0 refills | Status: DC | PRN

## 2021-03-07 MED ORDER — OXYCODONE-ACETAMINOPHEN 5-325 MG PO TABS
1.0000 | ORAL_TABLET | Freq: Once | ORAL | Status: AC
  Administered 2021-03-07: 03:00:00 1 via ORAL
  Filled 2021-03-07: qty 1

## 2021-03-07 MED ORDER — SODIUM CHLORIDE 0.9 % IV BOLUS
1000.0000 mL | Freq: Once | INTRAVENOUS | Status: AC
  Administered 2021-03-07: 12:00:00 1000 mL via INTRAVENOUS

## 2021-03-07 MED ORDER — MORPHINE SULFATE (PF) 4 MG/ML IV SOLN
4.0000 mg | Freq: Once | INTRAVENOUS | Status: AC
  Administered 2021-03-07: 12:00:00 4 mg via INTRAVENOUS
  Filled 2021-03-07: qty 1

## 2021-03-07 MED ORDER — HYDROCODONE-ACETAMINOPHEN 5-325 MG PO TABS: 1.0000 | ORAL_TABLET | Freq: Four times a day (QID) | ORAL | 0 refills | Status: DC | PRN

## 2021-03-07 MED ORDER — ONDANSETRON 4 MG PO TBDP
4.0000 mg | ORAL_TABLET | Freq: Once | ORAL | Status: AC
  Administered 2021-03-07: 03:00:00 4 mg via ORAL
  Filled 2021-03-07: qty 1

## 2021-03-07 MED ORDER — ONDANSETRON HCL 4 MG/2ML IJ SOLN
4.0000 mg | Freq: Once | INTRAMUSCULAR | Status: AC
  Administered 2021-03-07: 12:00:00 4 mg via INTRAVENOUS
  Filled 2021-03-07: qty 2

## 2021-03-07 NOTE — ED Provider Notes (Signed)
Emergency Medicine Provider Triage Evaluation Note  Dave Hernandez , a 25 y.o. male  was evaluated in triage.  Pt complains of left flank pain.  Started abruptly at 12:45AM.  States hx of stones on the right in the past with similar symptoms.  Some hematuria today, nausea, vomiting.  Followed by urology, Dr. Alvester [facility].  Review of Systems  Positive: Flank pain Negative: fever  Physical Exam  BP (!) 151/90 (BP Location: Right Arm)    Pulse 78    Temp (!) 97.5 F (36.4 C) (Oral)    Resp 16    Ht 6\' 3"  (1.905 m)    Wt 95.3 kg    SpO2 99%    BMI 26.25 kg/m   Gen:   Awake, no distress   Resp:  Normal effort  MSK:   Moves extremities without difficulty  Other:  Appears uncomfortable, writhing  Medical Decision Making  Medically screening exam initiated at 2:51 AM.  Appropriate orders placed.  Dave Hernandez was informed that the remainder of the evaluation will be completed by another provider, this initial triage assessment does not replace that evaluation, and the importance of remaining in the ED until their evaluation is complete.  Flank pain.  Suspect kidney stone.  Hx of same.  Labs, UA, CT renal stone study.   , PA-C 03/07/21 03/09/21    3329, MD 03/07/21 [phone]

## 2021-03-07 NOTE — ED Provider Notes (Signed)
[facility] EMERGENCY DEPARTMENT Provider Note   CSN: [id number] Arrival date & time: 03/07/21  0243     History Chief Complaint  Patient presents with   Abdominal Pain    Dave Hernandez is a 25 y.o. male.  HPI Patient presents with a male companion who assists with the history. Has a history of kidney stones, but is otherwise generally well.  He was seen and evaluated at another facility about 3 weeks ago.  Reportedly he had ultrasound with demonstration of right-sided kidney stones, was started on tamsulosin and NSAID.  He was well until about 11 hours ago when he awoke with left flank pain radiating to the left inguinal crease.  There is associated nausea, vomiting, pain is sharp, severe, not improved with NSAID.  No dysuria, though there has been hematuria.  No bowel movement changes.     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     No family history on file.  Social History   Tobacco Use   Smoking status: Every Day   Smokeless tobacco: Never  Substance Use Topics   Alcohol use: Yes   Drug use: Yes    Types: Marijuana    Home Medications Prior to Admission medications   Not on File    Allergies    Penicillins  Review of Systems   Review of Systems  Constitutional:        Per HPI, otherwise negative  HENT:         Per HPI, otherwise negative  Respiratory:         Per HPI, otherwise negative  Cardiovascular:        Per HPI, otherwise negative  Gastrointestinal:  Positive for nausea and vomiting.  Endocrine:       Negative aside from HPI  Genitourinary:        Neg aside from HPI   Musculoskeletal:        Per HPI, otherwise negative  Skin: Negative.   Neurological:  Negative for syncope.   Physical  Exam Updated Vital Signs BP (!) 153/87    Pulse 71    Temp 98.3 F (36.8 C) (Oral)    Resp 19    Ht 6\' 3"  (1.905 m)    Wt 95.3 kg    SpO2 100%    BMI 26.25 kg/m   Physical Exam Vitals and nursing note reviewed.  Constitutional:      General: He is not in acute distress.    Appearance: He is well-developed.  HENT:     Head: Normocephalic and atraumatic.  Eyes:     Conjunctiva/sclera: Conjunctivae normal.  Cardiovascular:     Rate and Rhythm: Normal rate and regular rhythm.  Pulmonary:     Effort: Pulmonary effort is normal. No respiratory distress.     Breath sounds: No stridor.  Abdominal:     General: There is no distension.  Skin:    General: Skin is warm and dry.  Neurological:     Mental Status: He is alert and oriented to person, place, and time.    ED Results / Procedures / Treatments   Labs (all labs ordered are listed, but only abnormal results are displayed) Labs Reviewed  BASIC METABOLIC  PANEL - Abnormal; Notable for the following components:      Result Value   Potassium 3.4 (*)    Glucose, Bld 141 (*)    All other components within normal limits  URINALYSIS, ROUTINE W REFLEX MICROSCOPIC - Abnormal; Notable for the following components:   APPearance CLOUDY (*)    Specific Gravity, Urine >1.030 (*)    Hgb urine dipstick LARGE (*)    Bilirubin Urine SMALL (*)    Ketones, ur 15 (*)    Protein, ur 100 (*)    All other components within normal limits  URINALYSIS, MICROSCOPIC (REFLEX) - Abnormal; Notable for the following components:   Bacteria, UA FEW (*)    All other components within normal limits  CBC WITH DIFFERENTIAL/PLATELET    EKG None  Radiology CT Renal Stone Study  Result Date: 03/07/2021 CLINICAL DATA:  Flank pain EXAM: CT ABDOMEN AND PELVIS WITHOUT CONTRAST TECHNIQUE: Multidetector CT imaging of the abdomen and pelvis was performed following the standard protocol without IV contrast. COMPARISON:  None. FINDINGS: Lower chest: The visualized  lung bases are clear. The visualized heart pericardium are unremarkable. Hepatobiliary: No focal liver abnormality is seen. No gallstones, gallbladder wall thickening, or biliary dilatation. Pancreas: Unremarkable Spleen: Unremarkable Adrenals/Urinary Tract: The adrenal glands are unremarkable. The kidneys are normal in size and position. There is mild left hydronephrosis and hydroureter to the level the left ureterovesicular junction where an obstructing 3 mm calculus is noted. At least 2 punctate 1-2 mm nonobstructing calculi are seen within the right kidney. No hydronephrosis on the right. No ureteral calculi on the right. The bladder is decompressed and is unremarkable. Stomach/Bowel: Stomach is within normal limits. Appendix appears normal. No evidence of bowel wall thickening, distention, or inflammatory changes. No free intraperitoneal gas. Vascular/Lymphatic: Circumaortic left renal vein. The abdominal vasculature is otherwise unremarkable. No pathologic adenopathy within the abdomen and pelvis. Reproductive: Prostate is unremarkable. Other: No abdominal wall hernia.  Rectum unremarkable. Musculoskeletal: No acute bone abnormality. No lytic or blastic bone lesion. IMPRESSION: Obstructing 3 mm calculus within the left ureterovesicular junction resulting in mild left hydronephrosis. Mild nonobstructing right nephrolithiasis. Electronically Signed   By: Fidela Salisbury M.D.   On: 03/07/2021 03:32    Procedures Procedures   Medications Ordered in ED Medications  ketorolac (TORADOL) 30 MG/ML injection 15 mg (has no administration in time range)  sodium chloride 0.9 % bolus 1,000 mL (has no administration in time range)  ondansetron (ZOFRAN) injection 4 mg (has no administration in time range)  morphine 4 MG/ML injection 4 mg (has no administration in time range)  oxyCODONE-acetaminophen (PERCOCET/ROXICET) 5-325 MG per tablet 1 tablet (1 tablet Oral Given 03/07/21 0313)  ondansetron (ZOFRAN-ODT)  disintegrating tablet 4 mg (4 mg Oral Given 03/07/21 [id number])    ED Course  I have reviewed the triage vital signs and the nursing notes.  Pertinent labs & imaging results that were available during my care of the patient were reviewed by me and considered in my medical decision making (see chart for details). 11:44 AM After initial evaluation I demonstrated the patient's CT images to him and his male companion illustrating the presence of a kidney stone as well as other physiologic findings.  Patient's initial labs notable for no evidence for infection, but with kidney stone, persistent nausea, vomiting, patient will require ongoing fluid resuscitation, Toradol, analgesics, antiemetics.    Patient improved  1:29 PM Patient markedly better, hemodynamically unremarkable.  We discussed importance of taking his home meds, NSAIDs,  Flomax, he will be provided with additional analgesics, antiemetics.  Stop MDM Rules/Calculators/A&P Adult male history of kidney stones otherwise well presents with cute onset nausea, vomiting, was found to have a 3 mm UVJ stone.  No evidence for concurrent infection, no evidence for complete obstruction, patient received fluids, Toradol, antiemetics, narcotics here with substantial improvement.  No evidence of bacteremia, sepsis, given his pain status he was discharged in stable condition to follow-up with urology.  Final Clinical Impression(s) / ED Diagnoses Final diagnoses:  Nephrolithiasis  Bilious vomiting with nausea     Carmin Muskrat, MD 03/07/21 1330

## 2021-03-07 NOTE — ED Triage Notes (Signed)
Pt reported to ED with c/o LLQ abdominal pain, reports hx of stones to right kidney. Pt endorses frequent urination with little output and blood in urine.

## 2021-03-07 NOTE — Discharge Instructions (Addendum)
As discussed, your evaluation today has been largely reassuring.  But, it is important that you monitor your condition carefully, and do not hesitate to return to the ED if you develop new, or concerning changes in your condition. ? ?Otherwise, please follow-up with your physician for appropriate ongoing care. ? ?

## 2022-07-31 LAB — LAB REPORT - SCANNED: EGFR: 125.2

## 2022-10-20 ENCOUNTER — Emergency Department (HOSPITAL_COMMUNITY)
Admission: EM | Admit: 2022-10-20 | Discharge: 2022-10-20 | Disposition: A | Discharge status: Home / Self Care | Payer: Self-pay | Attending: Emergency Medicine | Admitting: Emergency Medicine

## 2022-10-20 ENCOUNTER — Emergency Department (HOSPITAL_COMMUNITY): Payer: Self-pay

## 2022-10-20 ENCOUNTER — Other Ambulatory Visit: Payer: Self-pay

## 2022-10-20 DIAGNOSIS — R1013 Epigastric pain: Secondary | ICD-10-CM

## 2022-10-20 DIAGNOSIS — F17200 Nicotine dependence, unspecified, uncomplicated: Secondary | ICD-10-CM | POA: Insufficient documentation

## 2022-10-20 DIAGNOSIS — K219 Gastro-esophageal reflux disease without esophagitis: Secondary | ICD-10-CM | POA: Insufficient documentation

## 2022-10-20 DIAGNOSIS — R1012 Left upper quadrant pain: Secondary | ICD-10-CM

## 2022-10-20 LAB — URINALYSIS, ROUTINE W REFLEX MICROSCOPIC
Bilirubin Urine: NEGATIVE
Glucose, UA: NEGATIVE mg/dL
Hgb urine dipstick: NEGATIVE
Ketones, ur: NEGATIVE mg/dL
Leukocytes,Ua: NEGATIVE
Nitrite: NEGATIVE
Protein, ur: NEGATIVE mg/dL
Specific Gravity, Urine: 1.013 (ref 1.005–1.030)
pH: 7 (ref 5.0–8.0)

## 2022-10-20 LAB — HEPATIC FUNCTION PANEL
ALT: 37 U/L (ref 0–44)
AST: 21 U/L (ref 15–41)
Albumin: 4.3 g/dL (ref 3.5–5.0)
Alkaline Phosphatase: 51 U/L (ref 38–126)
Bilirubin, Direct: 0.1 mg/dL (ref 0.0–0.2)
Indirect Bilirubin: 0.9 mg/dL (ref 0.3–0.9)
Total Bilirubin: 1 mg/dL (ref 0.3–1.2)
Total Protein: 7.5 g/dL (ref 6.5–8.1)

## 2022-10-20 LAB — LIPASE, BLOOD: Lipase: 37 U/L (ref 11–51)

## 2022-10-20 LAB — BASIC METABOLIC PANEL
Anion gap: 12 (ref 5–15)
BUN: 11 mg/dL (ref 6–20)
CO2: 25 mmol/L (ref 22–32)
Calcium: 9.3 mg/dL (ref 8.9–10.3)
Chloride: 100 mmol/L (ref 98–111)
Creatinine, Ser: 0.88 mg/dL (ref 0.61–1.24)
GFR, Estimated: >60 mL/min (ref >60)
Glucose, Bld: 106 mg/dL — ABNORMAL HIGH (ref 70–99)
Potassium: 3.9 mmol/L (ref 3.5–5.1)
Sodium: 137 mmol/L (ref 135–145)

## 2022-10-20 LAB — CBC
HCT: 45.2 % (ref 39.0–52.0)
Hemoglobin: 15.5 g/dL (ref 13.0–17.0)
MCH: 29.9 pg (ref 26.0–34.0)
MCHC: 34.3 g/dL (ref 30.0–36.0)
MCV: 87.3 fL (ref 80.0–100.0)
Platelets: 252 10*3/uL (ref 150–400)
RBC: 5.18 MIL/uL (ref 4.22–5.81)
RDW: 12.6 % (ref 11.5–15.5)
WBC: 7.2 10*3/uL (ref 4.0–10.5)
nRBC: 0 % (ref 0.0–0.2)

## 2022-10-20 LAB — TROPONIN I (HIGH SENSITIVITY): Troponin I (High Sensitivity): 4 ng/L (ref <18)

## 2022-10-20 MED ORDER — PANTOPRAZOLE SODIUM 40 MG PO TBEC: 40.0000 mg | DELAYED_RELEASE_TABLET | Freq: Every day | ORAL | 1 refills | Status: DC

## 2022-10-20 MED ORDER — SUCRALFATE 1 G PO TABS: 1.0000 g | ORAL_TABLET | Freq: Three times a day (TID) | ORAL | 0 refills | Status: DC

## 2022-10-20 MED ORDER — IOHEXOL 350 MG/ML SOLN
75.0000 mL | Freq: Once | INTRAVENOUS | Status: AC | PRN
  Administered 2022-10-20: 75 mL via INTRAVENOUS

## 2022-10-20 NOTE — ED Notes (Signed)
Pt says his stomach feels bloated and uncomfortable. Pt wants to make sure his spleen.

## 2022-10-20 NOTE — ED Provider Notes (Signed)
[place] EMERGENCY DEPARTMENT AT [facility] Provider Note   CSN: [id number] Arrival date & time: 10/20/22  9147     History  Chief Complaint  Patient presents with   Chest Pain   Diarrhea   Nausea   Abdominal Pain    Dave Hernandez is a 27 y.o. male with past medical history significant for tobacco use, alcohol use, anxiety, depression presents to the ED complaining of epigastric pain, left upper quadrant pain, and pain along the left rib border for the past 3 days.  He endorses yellow-color diarrhea and nausea as well.  Patient is currently on omeprazole for reflux and was recently treated for H. Pylori.  He feels that his stomach pain was better after eating, but today he feels bloated around the painful area.  Denies chest tightness, shortness of breath, fever, cough, vomiting.         Home Medications Prior to Admission medications   Medication Sig Start Date End Date Taking? Authorizing Provider  pantoprazole (PROTONIX) 40 MG tablet Take 1 tablet (40 mg total) by mouth daily. 10/20/22  Yes Ramar Nobrega R, PA-C  sucralfate (CARAFATE) 1 g tablet Take 1 tablet (1 g total) by mouth 3 (three) times daily with meals. 10/20/22 11/19/22 Yes Livia Tarr R, PA-C  HYDROcodone-acetaminophen (NORCO/VICODIN) 5-325 MG tablet Take 1 tablet by mouth every 6 (six) hours as needed for severe pain. 03/07/21   Gerhard Munch, MD  ondansetron (ZOFRAN-ODT) 4 MG disintegrating tablet Take 1 tablet (4 mg total) by mouth every 8 (eight) hours as needed for nausea or vomiting. 03/07/21   Gerhard Munch, MD      Allergies    Penicillins    Review of Systems   Review of Systems  Constitutional:  Negative for fever.  Respiratory:  Negative for chest tightness and shortness of breath.   Cardiovascular:  Positive for chest pain.  Gastrointestinal:  Positive for abdominal pain, diarrhea and nausea. Negative for vomiting.    Physical Exam Updated Vital Signs BP 134/87 (BP Location:  Left Arm)   Pulse 67   Temp 98.3 F (36.8 C) (Oral)   Resp 18   Ht 6\' 3"  (1.905 m)   Wt 90.7 kg   SpO2 97%   BMI 25.00 kg/m  Physical Exam Vitals and nursing note reviewed.  Constitutional:      General: He is not in acute distress.    Appearance: Normal appearance. He is not ill-appearing or diaphoretic.  Cardiovascular:     Rate and Rhythm: Normal rate and regular rhythm.     Heart sounds: Normal heart sounds.  Pulmonary:     Effort: Pulmonary effort is normal.     Breath sounds: Normal breath sounds and air entry.  Abdominal:     General: Abdomen is flat. Bowel sounds are normal.     Palpations: Abdomen is soft.     Tenderness: There is abdominal tenderness in the epigastric area and left upper quadrant. There is no guarding or rebound. Negative signs include Murphy's sign.     Hernia: No hernia is present.  Skin:    General: Skin is warm and dry.     Capillary Refill: Capillary refill takes less than 2 seconds.  Neurological:     Mental Status: He is alert. Mental status is at baseline.  Psychiatric:        Mood and Affect: Mood normal.        Behavior: Behavior normal.     ED Results /  Procedures / Treatments   Labs (all labs ordered are listed, but only abnormal results are displayed) Labs Reviewed  BASIC METABOLIC PANEL - Abnormal; Notable for the following components:      Result Value   Glucose, Bld 106 (*)    All other components within normal limits  URINALYSIS, ROUTINE W REFLEX MICROSCOPIC - Abnormal; Notable for the following components:   APPearance HAZY (*)    All other components within normal limits  CBC  LIPASE, BLOOD  HEPATIC FUNCTION PANEL  TROPONIN I (HIGH SENSITIVITY)    EKG EKG Interpretation Date/Time:  Saturday October 20 2022 10:20:46 EDT Ventricular Rate:  73 PR Interval:  146 QRS Duration:  110 QT Interval:  364 QTC Calculation: 401 R Axis:   94  Text Interpretation: Normal sinus rhythm Rightward axis T wave abnormality,  consider inferior ischemia Abnormal ECG No significant change since last tracing Confirmed by Elayne Snare ([id number]) on 10/20/2022 11:34:26 AM  Radiology CT ABDOMEN PELVIS W CONTRAST  Result Date: 10/20/2022 CLINICAL DATA:  Abdominal pain, acute, nonlocalized. EXAM: CT ABDOMEN AND PELVIS WITH CONTRAST TECHNIQUE: Multidetector CT imaging of the abdomen and pelvis was performed using the standard protocol following bolus administration of intravenous contrast. RADIATION DOSE REDUCTION: This exam was performed according to the departmental dose-optimization program which includes automated exposure control, adjustment of the mA and/or kV according to patient size and/or use of iterative reconstruction technique. CONTRAST:  75mL OMNIPAQUE IOHEXOL 350 MG/ML SOLN COMPARISON:  03/07/2021 FINDINGS: Lower chest: Normal Hepatobiliary: Liver parenchyma is normal.  No calcified gallstones. Pancreas: Normal Spleen: Normal Adrenals/Urinary Tract: Adrenal glands are normal. Left kidney is normal. No mass, stone or hydronephrosis. Right kidney contains a 2 mm nonobstructing stone in the upper pole. No abnormality seen along the course of either ureter. No stone in the bladder. Tiny phlebolith in the right pelvis. Stomach/Bowel: Stomach and small intestine are normal. Normal appendix. Normal colon. Vascular/Lymphatic: Aorta and IVC are normal.  No adenopathy. Reproductive: Normal Other: No free fluid or air. Musculoskeletal: No abnormal skeletal finding. IMPRESSION: 1. No acute finding to explain the clinical presentation. 2. 2 mm nonobstructing stone in the upper pole of the right kidney. Electronically Signed   By: Paulina Fusi M.D.   On: 10/20/2022 13:09   DG Chest 2 View  Result Date: 10/20/2022 CLINICAL DATA:  Chest tightness. EXAM: CHEST - 2 VIEW COMPARISON:  Chest x-ray dated November 11, 2020. FINDINGS: The heart size and mediastinal contours are within normal limits. Both lungs are clear. The visualized skeletal  structures are unremarkable. IMPRESSION: No active cardiopulmonary disease. Electronically Signed   By: Obie Dredge M.D.   On: 10/20/2022 11:24    Procedures Procedures    Medications Ordered in ED Medications  iohexol (OMNIPAQUE) 350 MG/ML injection 75 mL (75 mLs Intravenous Contrast Given 10/20/22 1249)    ED Course/ Medical Decision Making/ A&P                                 Medical Decision Making Amount and/or Complexity of Data Reviewed Labs: ordered. Radiology: ordered.   This patient presents to the ED with chief complaint(s) of abdominal pain, epigastric pain, nausea and diarrhea with pertinent past medical history of GERD, H. Pylori, tobacco abuse, alcohol use.  The complaint involves an extensive differential diagnosis and also carries with it a high risk of complications and morbidity.    The differential diagnosis includes GERD, PUD,  H. Pylori, gastritis, pancreatitis, SBO, malignancy   The initial plan is to obtain labs including lipase, troponin, UA  Initial Assessment:   Exam significant for overall well appearing patient who is not in acute distress.  Tenderness to palpation of LUQ and epigastric region along the rib cage border.  No chest wall tenderness.  Abdomen is soft, no obvious distension.  No overlying skin changes.    Independent ECG/labs interpretation:  The following labs were independently interpreted:  CBC without leukocytosis or anemia.  Metabolic panel without major electrolyte disturbance.  Renal function normal.  Troponin negative.   Independent visualization and interpretation of imaging: I independently visualized the following imaging with scope of interpretation limited to determining acute life threatening conditions related to emergency care: CT abdomen and pelvis, which revealed no acute findings to explain patient's symptoms.  2 mm nonobstructing right sided kidney stone.   Treatment and Reassessment: Patient reports symptoms have  somewhat improved while he has been waiting for test results.  He has not had anything to eat or drink today.   Disposition:   Patient's workup is overall reassuring.  I suspect his symptoms are related to reflux and possible PUD.  Patient was treated for H. Pylori recently.  He has not established with GI provider, will provider referral information.  Will send patient home on pantoprazole and sucralfate to treat reflux and ulcers.  Discussed proper timing of when to take these medications.  Provided patient with food choices for GERD.    The patient has been appropriately medically screened and/or stabilized in the ED. I have low suspicion for any other emergent medical condition which would require further screening, evaluation or treatment in the ED or require inpatient management. At time of discharge the patient is hemodynamically stable and in no acute distress. I have discussed work-up results and diagnosis with patient and answered all questions. Patient is agreeable with discharge plan. We discussed strict return precautions for returning to the emergency department and they verbalized understanding.    Social Determinants of Health:   Patient's  tobacco use  increases the complexity of managing their presentation         Final Clinical Impression(s) / ED Diagnoses Final diagnoses:  Epigastric pain  LUQ abdominal pain  Gastroesophageal reflux disease, unspecified whether esophagitis present    Rx / DC Orders ED Discharge Orders          Ordered    sucralfate (CARAFATE) 1 g tablet  3 times daily with meals        10/20/22 1402    pantoprazole (PROTONIX) 40 MG tablet  Daily        10/20/22 1402              Lenard Simmer, PA-C 10/20/22 1405    Rexford Maus, DO 10/20/22 1509

## 2022-10-20 NOTE — Discharge Instructions (Addendum)
Thank you for allowing [place] to be a part of your care today.  You were evaluated in the ED for abdominal pain.   Your CT scan was reassuring and did not have evidence of an acute cause of your symptoms.  You do have a 2 mm non-obstructing kidney stone on the right side.  There is no specific follow up for this unless the stone begins to move and causes symptoms.   I have provided referral information for [facility].  Please call their office and schedule a follow up appointment.  I am also sending you home on 40 mg pantoprazole and sucralfate.  Take the pantoprazole 60 minutes prior to eating every morning.  You will take the sucralfate before every meal.  Discontinue taking your omeprazole.  I have included information on food choices for people with reflux.  Be sure to do your best to avoid trigger foods as that may make symptoms worse.  You may continue to use Mylanta or TUMS if you have breakthrough reflux despite medications.   Return to the ED if you develop sudden worsening of your symptoms or if you have any new concerns.

## 2022-10-20 NOTE — ED Notes (Signed)
Patient transported to X-ray 

## 2022-10-20 NOTE — ED Notes (Signed)
Pt ambulated to restroom without incident.

## 2022-10-20 NOTE — ED Triage Notes (Signed)
Pt. Stated, Dave Hernandez had stomach pain which Ive had before cause my Dr says I have stomach ulcers. For 2 days Ive had some chest tightness right below my left nipple and like I have a knot there.. It hurts if you really push down on it, but you have to push hard. Ive also had some diarrhea and nausea for 2 days.

## 2022-10-26 ENCOUNTER — Encounter: Payer: Self-pay | Admitting: Gastroenterology

## 2022-10-26 ENCOUNTER — Ambulatory Visit: Payer: Self-pay | Admitting: Gastroenterology

## 2022-10-26 VITALS — BP 112/70 | HR 72 | Ht 72.25 in | Wt 199.1 lb

## 2022-10-26 DIAGNOSIS — R1012 Left upper quadrant pain: Secondary | ICD-10-CM

## 2022-10-26 DIAGNOSIS — Z8619 Personal history of other infectious and parasitic diseases: Secondary | ICD-10-CM

## 2022-10-26 DIAGNOSIS — K219 Gastro-esophageal reflux disease without esophagitis: Secondary | ICD-10-CM

## 2022-10-26 NOTE — Patient Instructions (Signed)
Follow up as needed  _______________________________________________________  If your blood pressure at your visit was 140/90 or greater, please contact your primary care physician to follow up on this.  _______________________________________________________  If you are age 27 or older, your body mass index should be between 23-30. Your Body mass index is 26.82 kg/m. If this is out of the aforementioned range listed, please consider follow up with your Primary Care Provider.  If you are age 27 or younger, your body mass index should be between 19-25. Your Body mass index is 26.82 kg/m. If this is out of the aformentioned range listed, please consider follow up with your Primary Care Provider.   ________________________________________________________  The [facility] providers would like to encourage you to use [facility] to communicate with providers for non-urgent requests or questions.  Due to long hold times on the telephone, sending your provider a message by [facility] may be a faster and more efficient way to get a response.  Please allow 48 business hours for a response.  Please remember that this is for non-urgent requests.  _______________________________________________________   I appreciate the  opportunity to care for you  Thank You   [facility]

## 2022-10-26 NOTE — Progress Notes (Signed)
Chief Complaint: Hospital follow-up Primary GI MD: Gentry Fitz HPI: 27 year old male presents with his girlfriend today for ED follow-up.  Recently seen in ED 10/20/2022 for LUQ pain and reflux.  Patient reports longstanding history of reflux for which she was previously on omeprazole 20 Mg 4.  Receives his care in [place].  States he has been diagnosed with H. pylori in the past via a blood test and completed antibiotic course.  States after his antibiotic course his symptoms had improved.  States the morning of his emergency department visit he woke up with LUQ burning pain and was concerned for his spleen.  He then was evaluated in emergency department with CBC, CMP, lipase all unrevealing.  CT abdomen pelvis with contrast also was unrevealing.  Denies previous endoscopies.  States he has been under a lot of stress lately.  Lost his grandfather a few days ago.  Patient takes Aleve intermittently.  States his symptoms are currently well-controlled on pantoprazole 40 Mg once daily and Carafate as needed.  He recently lost his insurance and does not have insurance until November 1.     PREVIOUS GI WORKUP     Past Medical History:  Diagnosis Date   Anxiety    Asthma    COVID-19    Depression    Kidney stones    Pneumonia     Past Surgical History:  Procedure Laterality Date   NO PAST SURGERIES      Current Outpatient Medications  Medication Sig Dispense Refill   pantoprazole (PROTONIX) 40 MG tablet Take 1 tablet (40 mg total) by mouth daily. 30 tablet 1   sucralfate (CARAFATE) 1 g tablet Take 1 tablet (1 g total) by mouth 3 (three) times daily with meals. 90 tablet 0   No current facility-administered medications for this visit.    Allergies as of 10/26/2022 - Review Complete 10/20/2022  Allergen Reaction Noted   Penicillins Other (See Comments) 07/17/2016    Family History  Problem Relation Age of Onset   Heart attack Mother    Diabetes Father    Heart failure  Maternal Grandfather    Breast cancer Paternal Grandmother    Diabetes Paternal Grandfather    Prostate cancer Paternal Uncle    Diabetes Paternal Uncle     Social History   Socioeconomic History   Marital status: Single    Spouse name: Not on file   Number of children: 0   Years of education: Not on file   Highest education level: Not on file  Occupational History   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  Tobacco Use   Smoking status: Former    Current packs/day: 0.00    Types: Cigarettes    Quit date: 2018    Years since quitting: 6.6   Smokeless tobacco: Never  Vaping Use   Vaping status: Former   Quit date: 03/19/2016  Substance and Sexual Activity   Alcohol use: Yes    Comment: rarely   Drug use: Yes    Types: Marijuana   Sexual activity: Not on file  Other Topics Concern   Not on file  Social History Narrative   Not on file   Social Determinants of Health   Financial Resource Strain: Not on file  Food Insecurity: Not on file  Transportation Needs: Not on file  Physical Activity: Not on file  Stress: Not on file  Social Connections: Not on file  Intimate Partner Violence: Not on file    Review of Systems:    Constitutional:  No weight loss, fever, chills, weakness or fatigue HEENT: Eyes: No change in vision               Ears, Nose, Throat:  No change in hearing or congestion Skin: No rash or itching Cardiovascular: No chest pain, chest pressure or palpitations   Respiratory: No SOB or cough Gastrointestinal: See HPI and otherwise negative Genitourinary: No dysuria or change in urinary frequency Neurological: No headache, dizziness or syncope Musculoskeletal: No new muscle or joint pain Hematologic: No bleeding or bruising Psychiatric: No history of depression or anxiety    Physical Exam:  Vital signs: BP 112/70 (BP Location: Left Arm, Patient Position: Sitting, Cuff Size: Normal)   Pulse 72   Ht 6' 0.25" (1.835 m) Comment: height measured without shoes  Wt  90.3 kg   BMI 26.82 kg/m   Constitutional: NAD, Well developed, Well nourished, alert and cooperative Head:  Normocephalic and atraumatic. Eyes:   PEERL, EOMI. No icterus. Conjunctiva pink. Respiratory: Respirations even and unlabored. Lungs clear to auscultation bilaterally.   No wheezes, crackles, or rhonchi.  Cardiovascular:  Regular rate and rhythm. No peripheral edema, cyanosis or pallor.  Gastrointestinal:  Soft, nondistended, nontender. No rebound or guarding. Normal bowel sounds. No appreciable masses or hepatomegaly. Rectal:  Not performed.  Msk:  Symmetrical without gross deformities. Without edema, no deformity or joint abnormality.  Neurologic:  Alert and  oriented x4;  grossly normal neurologically.  Skin:   Dry and intact without significant lesions or rashes. Psychiatric: Oriented to person, place and time. Demonstrates good judgement and reason without abnormal affect or behaviors.   RELEVANT LABS AND IMAGING: CBC    Component Value Date/Time   WBC 7.2 10/20/2022 1034   RBC 5.18 10/20/2022 1034   HGB 15.5 10/20/2022 1034   HCT 45.2 10/20/2022 1034   PLT 252 10/20/2022 1034   MCV 87.3 10/20/2022 1034   MCH 29.9 10/20/2022 1034   MCHC 34.3 10/20/2022 1034   RDW 12.6 10/20/2022 1034   LYMPHSABS 3.3 03/07/2021 0257   MONOABS 0.9 03/07/2021 0257   EOSABS 0.2 03/07/2021 0257   BASOSABS 0.1 03/07/2021 0257    CMP     Component Value Date/Time   NA 137 10/20/2022 1034   NA 141 04/09/2019 1648   K 3.9 10/20/2022 1034   CL 100 10/20/2022 1034   CO2 25 10/20/2022 1034   GLUCOSE 106 (H) 10/20/2022 1034   BUN 11 10/20/2022 1034   BUN 10 04/09/2019 1648   CREATININE 0.88 10/20/2022 1034   CALCIUM 9.3 10/20/2022 1034   PROT 7.5 10/20/2022 1034   ALBUMIN 4.3 10/20/2022 1034   AST 21 10/20/2022 1034   ALT 37 10/20/2022 1034   ALKPHOS 51 10/20/2022 1034   BILITOT 1.0 10/20/2022 1034   GFRNONAA >60 10/20/2022 1034   GFRAA 135 04/09/2019 1648      Assessment/Plan:   Gastroesophageal reflux disease, unspecified whether esophagitis present LUQ pain Recent ED visit with unrevealing CT abdomen pelvis, lipase, CMP, CBC.  Symptoms currently well-controlled on pantoprazole and Carafate.  He currently does not have insurance and will not until November 1. --- Discussed conservative management versus EGD.  Due to not having insurance we will wait to proceed with EGD until he obtains insurance. --- Educated patient on lifestyle modifications and provided extensive patient education handouts. --- Continue Protonix daily and Carafate as needed --- Patient will contact us once he obtains insurance for further evaluation  History of Helicobacter pylori infection History of H. pylori  infection within the last year treated with antibiotic therapy with improvement in symptoms.  Test was via blood test. --- Discussed H. pylori stool study to test for eradication versus EGD for further evaluation.  Patient currently does not have insurance and symptoms are improved at this time. --- Wait until November 1 when he has insurance and then he will MyChart [place] with his decision whether or not he wants to proceed with EGD versus conservative management with simple stool study.  Advised with H. pylori infection it is best to get EGD for visualization and biopsies.  Lara Mulch [facility] 10/26/2022, 10:30 AM  Cc: Bing Neighbors, NP

## 2022-10-27 NOTE — Progress Notes (Signed)
Attending Physician's Attestation   I have reviewed the chart.   I agree with the Advanced Practitioner's note, impression, and recommendations with any updates as below. Glad to hear his symptoms are improved.  If he continues to have issues and remains on PPI, then would pursue H. pylori Diatherix PCR to ensure that he has not had a reinfection.  If he does not but continues to have symptoms then endoscopic evaluation is very reasonable.   Corliss Parish, MD [facility] Advanced Endoscopy Office # [phone]

## 2022-12-05 ENCOUNTER — Other Ambulatory Visit: Payer: Self-pay

## 2022-12-05 ENCOUNTER — Emergency Department (HOSPITAL_COMMUNITY)
Admission: EM | Admit: 2022-12-05 | Discharge: 2022-12-06 | Disposition: A | Discharge status: Home / Self Care | Payer: Self-pay | Attending: Emergency Medicine | Admitting: Emergency Medicine

## 2022-12-05 ENCOUNTER — Emergency Department (HOSPITAL_COMMUNITY): Payer: Self-pay

## 2022-12-05 ENCOUNTER — Encounter (HOSPITAL_COMMUNITY): Payer: Self-pay | Admitting: Emergency Medicine

## 2022-12-05 DIAGNOSIS — G44209 Tension-type headache, unspecified, not intractable: Secondary | ICD-10-CM | POA: Insufficient documentation

## 2022-12-05 DIAGNOSIS — R7309 Other abnormal glucose: Secondary | ICD-10-CM | POA: Insufficient documentation

## 2022-12-05 LAB — CBC WITH DIFFERENTIAL/PLATELET
Abs Immature Granulocytes: 0.01 10*3/uL (ref 0.00–0.07)
Basophils Absolute: 0 10*3/uL (ref 0.0–0.1)
Basophils Relative: 1 %
Eosinophils Absolute: 0.2 10*3/uL (ref 0.0–0.5)
Eosinophils Relative: 3 %
HCT: 43.2 % (ref 39.0–52.0)
Hemoglobin: 14.7 g/dL (ref 13.0–17.0)
Immature Granulocytes: 0 %
Lymphocytes Relative: 42 %
Lymphs Abs: 2.8 10*3/uL (ref 0.7–4.0)
MCH: 30 pg (ref 26.0–34.0)
MCHC: 34 g/dL (ref 30.0–36.0)
MCV: 88.2 fL (ref 80.0–100.0)
Monocytes Absolute: 0.6 10*3/uL (ref 0.1–1.0)
Monocytes Relative: 9 %
Neutro Abs: 3.1 10*3/uL (ref 1.7–7.7)
Neutrophils Relative %: 45 %
Platelets: 260 10*3/uL (ref 150–400)
RBC: 4.9 MIL/uL (ref 4.22–5.81)
RDW: 12.8 % (ref 11.5–15.5)
WBC: 6.7 10*3/uL (ref 4.0–10.5)
nRBC: 0 % (ref 0.0–0.2)

## 2022-12-05 LAB — BASIC METABOLIC PANEL WITH GFR
Anion gap: 13 (ref 5–15)
BUN: 9 mg/dL (ref 6–20)
CO2: 27 mmol/L (ref 22–32)
Calcium: 9.3 mg/dL (ref 8.9–10.3)
Chloride: 102 mmol/L (ref 98–111)
Creatinine, Ser: 0.91 mg/dL (ref 0.61–1.24)
GFR, Estimated: >60 mL/min (ref >60)
Glucose, Bld: 110 mg/dL — ABNORMAL HIGH (ref 70–99)
Potassium: 3.8 mmol/L (ref 3.5–5.1)
Sodium: 142 mmol/L (ref 135–145)

## 2022-12-05 LAB — CBG MONITORING, ED: Glucose-Capillary: 144 mg/dL — ABNORMAL HIGH (ref 70–99)

## 2022-12-05 NOTE — ED Triage Notes (Addendum)
Patient c/o left side headache and dizziness that started yesterday.  Patient denies history of migraines.  Patient has recent history of ear infection in left ear.

## 2022-12-05 NOTE — ED Provider Triage Note (Signed)
Emergency Medicine Provider Triage Evaluation Note  Diron Vonderheide Telfair , a 27 y.o. male  was evaluated in triage.  Pt complains of headaches.  He has had intermittent headaches for the past couple weeks.  They are getting more intense and occurring more often.  Localized mostly to the left side of the head.  He is also complaining of an ear infection for which she is on antibiotics.  He had a recent dental infection as well.  He did have some blurred vision earlier today as well as some dizziness with the headache.  No fevers.  Review of Systems  Positive: As above Negative: As above  Physical Exam  BP (!) 143/94   Pulse 66   Temp 98.5 F (36.9 C) (Oral)   Resp 18   Wt 92 kg   SpO2 99%   BMI 27.32 kg/m  Gen:   Awake, no distress   Resp:  Normal effort  MSK:   Moves extremities without difficulty  Other:    Medical Decision Making  Medically screening exam initiated at 8:06 PM.  Appropriate orders placed.  Dontay L Lahmann was informed that the remainder of the evaluation will be completed by another provider, this initial triage assessment does not replace that evaluation, and the importance of remaining in the ED until their evaluation is complete.  Workup initiated   Mora Bellman 12/05/22 2007

## 2022-12-06 MED ORDER — DEXAMETHASONE SODIUM PHOSPHATE 10 MG/ML IJ SOLN
10.0000 mg | Freq: Once | INTRAMUSCULAR | Status: AC
  Administered 2022-12-06: 10 mg via INTRAVENOUS
  Filled 2022-12-06: qty 1

## 2022-12-06 MED ORDER — PROCHLORPERAZINE EDISYLATE 10 MG/2ML IJ SOLN
10.0000 mg | Freq: Once | INTRAMUSCULAR | Status: AC
  Administered 2022-12-06: 10 mg via INTRAVENOUS
  Filled 2022-12-06: qty 2

## 2022-12-06 MED ORDER — SODIUM CHLORIDE 0.9 % IV BOLUS
1000.0000 mL | Freq: Once | INTRAVENOUS | Status: AC
  Administered 2022-12-06: 1000 mL via INTRAVENOUS

## 2022-12-06 NOTE — ED Provider Notes (Signed)
[place] EMERGENCY DEPARTMENT AT [facility] Provider Note   CSN: [id number] Arrival date & time: 12/05/22  1942     History  Chief Complaint  Patient presents with   Headache   Dizziness    Dave Hernandez is a 27 y.o. male.  The history is provided by the patient.  Headache Associated symptoms: dizziness   Dizziness Associated symptoms: headaches    He has history of anxiety and comes in because of left-sided headache.  He has been treated for problems with his left ear where he initially had some pain in the ear which was felt to be an infection and started on cefdinir.  He was close to completing the course of cefdinir when he developed some pain and swelling in his gum and was switched to clindamycin.  Headache did seem to get better, but then started getting worse several days ago.  He describes a pressure feeling in the left temporoparietal area.  He denies any visual change, photophobia, nausea, vomiting, weakness, numbness.  However, he states that he is concerned that he might have a brain tumor.  He has not taken anything for pain.   Home Medications Prior to Admission medications   Medication Sig Start Date End Date Taking? Authorizing Provider  pantoprazole (PROTONIX) 40 MG tablet Take 1 tablet (40 mg total) by mouth daily. 10/20/22   Clark, Meghan R, PA-C  sucralfate (CARAFATE) 1 g tablet Take 1 tablet (1 g total) by mouth 3 (three) times daily with meals. 10/20/22 11/19/22  Melton Alar R, PA-C      Allergies    Penicillins    Review of Systems   Review of Systems  Neurological:  Positive for dizziness and headaches.  All other systems reviewed and are negative.   Physical Exam Updated Vital Signs BP 134/89   Pulse 61   Temp 97.7 F (36.5 C)   Resp 16   Wt 92 kg   SpO2 100%   BMI 27.32 kg/m  Physical Exam Vitals and nursing note reviewed.   27 year old male, resting comfortably and in no acute distress. Vital signs are normal. Oxygen  saturation is 100%, which is normal. Head is normocephalic and atraumatic. PERRLA, EOMI. Oropharynx is clear.  There is tenderness palpation over the left temporalis muscle with no tenderness over the right temporalis muscle or the insertion of the paracervical muscles.  Tympanic membranes are clear.  There is no tenderness in the preauricular area and there is no pain elicited when tension is applied to the helix. Neck is nontender and supple without adenopathy . Back is nontender and there is no CVA tenderness. Lungs are clear without rales, wheezes, or rhonchi. Chest is nontender. Heart has regular rate and rhythm without murmur. Abdomen is soft, flat, nontender. Extremities have no cyanosis or edema, full range of motion is present. Skin is warm and dry without rash. Neurologic: Mental status is normal, cranial nerves are intact, strength is 5/5 in all 4 extremities.  ED Results / Procedures / Treatments   Labs (all labs ordered are listed, but only abnormal results are displayed) Labs Reviewed  BASIC METABOLIC PANEL - Abnormal; Notable for the following components:      Result Value   Glucose, Bld 110 (*)    All other components within normal limits  CBG MONITORING, ED - Abnormal; Notable for the following components:   Glucose-Capillary 144 (*)    All other components within normal limits  CBC WITH DIFFERENTIAL/PLATELET  EKG None  Radiology CT Head Wo Contrast  Result Date: 12/05/2022 CLINICAL DATA:  Headache EXAM: CT HEAD WITHOUT CONTRAST TECHNIQUE: Contiguous axial images were obtained from the base of the skull through the vertex without intravenous contrast. RADIATION DOSE REDUCTION: This exam was performed according to the departmental dose-optimization program which includes automated exposure control, adjustment of the mA and/or kV according to patient size and/or use of iterative reconstruction technique. COMPARISON:  None Available. FINDINGS: Brain: No evidence of  acute infarction, hemorrhage, hydrocephalus, extra-axial collection or mass lesion/mass effect. Vascular: No hyperdense vessel or unexpected calcification. Skull: Normal. Negative for fracture or focal lesion. Sinuses/Orbits: The visualized paranasal sinuses are essentially clear. The mastoid air cells are unopacified. Other: None. IMPRESSION: Normal head CT. Electronically Signed   By: Charline Bills M.D.   On: 12/05/2022 20:56    Procedures Procedures    Medications Ordered in ED Medications - No data to display  ED Course/ Medical Decision Making/ A&P                                 Medical Decision Making Risk Prescription drug management.   Headache with exam making muscle contraction headache the most likely etiology.  It does not sound like a migraine headache, doubt serious pathology such as meningitis, subarachnoid hemorrhage, intracranial tumor.  I have reviewed laboratory work done at triage and my interpretation is mild elevation of random glucose which will need to be followed as an outpatient, normal CBC.  CT of head is normal.  I independently viewed the images, and agree with radiologist's interpretation.  Additionally, I looked at his mastoid cells and see no evidence of mastoiditis.  I have ordered headache cocktail of normal saline solution, prochlorperazine, dexamethasone.  He had excellent relief of headache with above-noted treatment.  He still is concerned about his ear, I am giving him a referral to ENT for follow-up.  I have advised him to use topical measures such as ice, use over-the-counter NSAIDs and acetaminophen as needed for pain.  He was reassured that there is no evidence of any serious pathology.  Final Clinical Impression(s) / ED Diagnoses Final diagnoses:  Muscle contraction headache  Elevated random blood glucose level    Rx / DC Orders ED Discharge Orders     None         Dione Booze, MD 12/06/22 [phone]

## 2022-12-06 NOTE — Discharge Instructions (Addendum)
If the headache comes back, you may try applying ice.  Ice can be applied for 30 minutes at a time, up to 4 times a day.  You may also take acetaminophen and/or ibuprofen as needed for pain.  If necessary, you can combine acetaminophen and ibuprofen.  When you combine them, you get better pain relief than you get from either medication by itself.  Your blood sugar was a little high.  This will need to be followed by your primary care provider.  It is important that it be checked periodically so that if you do develop diabetes, treatment can be started promptly.

## 2023-01-06 ENCOUNTER — Other Ambulatory Visit: Payer: Self-pay

## 2023-01-06 ENCOUNTER — Encounter (HOSPITAL_COMMUNITY): Payer: Self-pay

## 2023-01-06 ENCOUNTER — Emergency Department (HOSPITAL_COMMUNITY): Payer: Commercial Managed Care - HMO

## 2023-01-06 ENCOUNTER — Emergency Department (HOSPITAL_COMMUNITY)
Admission: EM | Admit: 2023-01-06 | Discharge: 2023-01-06 | Disposition: A | Discharge status: Home / Self Care | Payer: Commercial Managed Care - HMO | Attending: Emergency Medicine | Admitting: Emergency Medicine

## 2023-01-06 DIAGNOSIS — J36 Peritonsillar abscess: Secondary | ICD-10-CM | POA: Insufficient documentation

## 2023-01-06 DIAGNOSIS — R220 Localized swelling, mass and lump, head: Secondary | ICD-10-CM | POA: Diagnosis present

## 2023-01-06 DIAGNOSIS — Z8616 Personal history of COVID-19: Secondary | ICD-10-CM | POA: Insufficient documentation

## 2023-01-06 DIAGNOSIS — J45909 Unspecified asthma, uncomplicated: Secondary | ICD-10-CM | POA: Diagnosis not present

## 2023-01-06 LAB — I-STAT CHEM 8, ED
BUN: 7 mg/dL (ref 6–20)
Calcium, Ion: 1.14 mmol/L — ABNORMAL LOW (ref 1.15–1.40)
Chloride: 100 mmol/L (ref 98–111)
Creatinine, Ser: 0.8 mg/dL (ref 0.61–1.24)
Glucose, Bld: 93 mg/dL (ref 70–99)
HCT: 44 % (ref 39.0–52.0)
Hemoglobin: 15 g/dL (ref 13.0–17.0)
Potassium: 3.4 mmol/L — ABNORMAL LOW (ref 3.5–5.1)
Sodium: 140 mmol/L (ref 135–145)
TCO2: 24 mmol/L (ref 22–32)

## 2023-01-06 LAB — CBC WITH DIFFERENTIAL/PLATELET
Abs Immature Granulocytes: 0.02 10*3/uL (ref 0.00–0.07)
Basophils Absolute: 0 10*3/uL (ref 0.0–0.1)
Basophils Relative: 0 %
Eosinophils Absolute: 0 10*3/uL (ref 0.0–0.5)
Eosinophils Relative: 0 %
HCT: 42.9 % (ref 39.0–52.0)
Hemoglobin: 14.5 g/dL (ref 13.0–17.0)
Immature Granulocytes: 0 %
Lymphocytes Relative: 28 %
Lymphs Abs: 2.5 10*3/uL (ref 0.7–4.0)
MCH: 29.2 pg (ref 26.0–34.0)
MCHC: 33.8 g/dL (ref 30.0–36.0)
MCV: 86.5 fL (ref 80.0–100.0)
Monocytes Absolute: 0.8 10*3/uL (ref 0.1–1.0)
Monocytes Relative: 9 %
Neutro Abs: 5.5 10*3/uL (ref 1.7–7.7)
Neutrophils Relative %: 63 %
Platelets: 261 10*3/uL (ref 150–400)
RBC: 4.96 MIL/uL (ref 4.22–5.81)
RDW: 12.7 % (ref 11.5–15.5)
WBC: 8.9 10*3/uL (ref 4.0–10.5)
nRBC: 0 % (ref 0.0–0.2)

## 2023-01-06 LAB — BASIC METABOLIC PANEL
Anion gap: 11 (ref 5–15)
BUN: 8 mg/dL (ref 6–20)
CO2: 24 mmol/L (ref 22–32)
Calcium: 9 mg/dL (ref 8.9–10.3)
Chloride: 102 mmol/L (ref 98–111)
Creatinine, Ser: 0.87 mg/dL (ref 0.61–1.24)
GFR, Estimated: >60 mL/min (ref >60)
Glucose, Bld: 93 mg/dL (ref 70–99)
Potassium: 3.6 mmol/L (ref 3.5–5.1)
Sodium: 137 mmol/L (ref 135–145)

## 2023-01-06 MED ORDER — IOHEXOL 350 MG/ML SOLN
75.0000 mL | Freq: Once | INTRAVENOUS | Status: AC | PRN
  Administered 2023-01-06: 75 mL via INTRAVENOUS

## 2023-01-06 MED ORDER — AMOXICILLIN-POT CLAVULANATE 875-125 MG PO TABS
1.0000 | ORAL_TABLET | Freq: Two times a day (BID) | ORAL | 0 refills | Status: AC
Start: 2023-01-06 — End: 2023-01-20

## 2023-01-06 MED ORDER — PREDNISONE 20 MG PO TABS: 40.0000 mg | ORAL_TABLET | Freq: Every day | ORAL | 0 refills | Status: AC

## 2023-01-06 MED ORDER — AMOXICILLIN-POT CLAVULANATE 875-125 MG PO TABS
1.0000 | ORAL_TABLET | Freq: Once | ORAL | Status: AC
  Administered 2023-01-06: 1 via ORAL
  Filled 2023-01-06: qty 1

## 2023-01-06 MED ORDER — DEXAMETHASONE SODIUM PHOSPHATE 10 MG/ML IJ SOLN
10.0000 mg | Freq: Once | INTRAMUSCULAR | Status: AC
  Administered 2023-01-06: 10 mg via INTRAVENOUS
  Filled 2023-01-06: qty 1

## 2023-01-06 NOTE — ED Notes (Signed)
Pt ambulated to restroom without incident.

## 2023-01-06 NOTE — Discharge Instructions (Addendum)
It was a pleasure caring for you today. Please go to Dr. Leighton Roach office in the morning. I have sent your new prescription for Augmentin to your pharmacy. You can stop taking the Clindamycin. Seek emergency care if experiencing any new or worsening symptoms.

## 2023-01-06 NOTE — ED Notes (Signed)
Pt states he can work his tongue in a hole that is above the abscess that he can work the purulent drainage out.  Pt says he hasn't been given an official dx of what is going on.

## 2023-01-06 NOTE — ED Notes (Signed)
RN provided pt warm blankets

## 2023-01-06 NOTE — ED Notes (Signed)
Pt was seen at [facility]  center where he was provided azithromycin and prednisone. Pt noticed lump on right side of his throat wasn't improving and decided to f/u with UC. UC prescribed pt another type of abx. Pt states he hasn't received a CT scan and feels that his face has started swelling yesterday evening. Pt is concerned that the abscess may burst and wants a further workup.  Pt denies drooling and choking. Pt states he is eating soft foods.

## 2023-01-06 NOTE — ED Notes (Signed)
Patient transported to CT 

## 2023-01-06 NOTE — ED Triage Notes (Signed)
PT arrived POV from home c/o a sore throat, right sided facial swelling and pain. Pt states he had an peritonsillar abscess that he was dignosed with a week ago and given antibiotics but the pain and swelling is getting worse and now he is having facial pain on the right side and a headache.

## 2023-01-06 NOTE — ED Provider Notes (Signed)
[place] EMERGENCY DEPARTMENT AT [facility] Provider Note   CSN: [id number] Arrival date & time: 01/06/23  4098     History  Chief Complaint  Patient presents with   Sore Throat   Facial Swelling    Dave Hernandez is a 27 y.o. male with a past history of asthma presents today for evaluation of sore throat.  Patient reports that he can see an abscess in his throat.  He endorses cold/chills.  Denies any fever.  Denies any issue with eating, drinking or breathing.  He was given clindamycin for the abscess however he reports no improvement.   Sore Throat    Past Medical History:  Diagnosis Date   Anxiety    Asthma    COVID-19    Depression    Kidney stones    Pneumonia    Past Surgical History:  Procedure Laterality Date   NO PAST SURGERIES       Home Medications Prior to Admission medications   Medication Sig Start Date End Date Taking? Authorizing Provider  pantoprazole (PROTONIX) 40 MG tablet Take 1 tablet (40 mg total) by mouth daily. 10/20/22   Clark, Meghan R, PA-C  sucralfate (CARAFATE) 1 g tablet Take 1 tablet (1 g total) by mouth 3 (three) times daily with meals. 10/20/22 11/19/22  Melton Alar R, PA-C      Allergies    Penicillins    Review of Systems   Review of Systems Negative except as per HPI.  Physical Exam Updated Vital Signs BP (!) 146/75   Pulse 71   Temp 98.3 F (36.8 C) (Oral)   Resp 15   Ht 6\' 2"  (1.88 m)   Wt 89.8 kg   SpO2 99%   BMI 25.42 kg/m  Physical Exam Vitals and nursing note reviewed.  Constitutional:      Appearance: Normal appearance.  HENT:     Head: Normocephalic and atraumatic.     Mouth/Throat:     Mouth: Mucous membranes are moist.     Comments: Small fluctuance with pus noted on the R tonsil. No active drainage. Eyes:     General: No scleral icterus. Cardiovascular:     Rate and Rhythm: Normal rate and regular rhythm.     Pulses: Normal pulses.     Heart sounds: Normal heart sounds.   Pulmonary:     Effort: Pulmonary effort is normal.     Breath sounds: Normal breath sounds.  Abdominal:     General: Abdomen is flat.     Palpations: Abdomen is soft.     Tenderness: There is no abdominal tenderness.  Musculoskeletal:        General: No deformity.  Skin:    General: Skin is warm.     Findings: No rash.  Neurological:     General: No focal deficit present.     Mental Status: He is alert.  Psychiatric:        Mood and Affect: Mood normal.     ED Results / Procedures / Treatments   Labs (all labs ordered are listed, but only abnormal results are displayed) Labs Reviewed - No data to display  EKG None  Radiology No results found.  Procedures Procedures    Medications Ordered in ED Medications - No data to display  ED Course/ Medical Decision Making/ A&P  Medical Decision Making Amount and/or Complexity of Data Reviewed Labs: ordered. Radiology: ordered.  Risk Prescription drug management.   This patient presents to the ED for sore throat, this involves an extensive number of treatment options, and is a complaint that carries with a high risk of complications and morbidity.  The differential diagnosis includes strep, peritonsillar abscess, retropharyngeal abscess, epiglottitis infectious etiology.  This is not an exhaustive list.  Lab tests: I ordered and personally interpreted labs.  The pertinent results include: WBC unremarkable. Hbg unremarkable. Platelets unremarkable. Electrolytes unremarkable. BUN, creatinine unremarkable.   Imaging studies: CT soft tissue of the neck ordered and pending.  Problem list/ ED course/ Critical interventions/ Medical management: HPI: See above Vital signs within normal range and stable throughout visit. Laboratory/imaging studies significant for: See above. On physical examination, patient is afebrile and appears in no acute distress.  There is a small fluctuance with white  pus noted on the right tonsil.  No active drainage.  Patient airway is protected.  CT soft tissue neck ordered to rule out peritonsillar abscess.  Given Decadron. Will consult ENT if needed. I have reviewed the patient home medicines and have made adjustments as needed.  Cardiac monitoring/EKG: The patient was maintained on a cardiac monitor.  I personally reviewed and interpreted the cardiac monitor which showed an underlying rhythm of: sinus rhythm.  Additional history obtained: External records from outside source obtained and reviewed including: Chart review including previous notes, labs, imaging.  Consultations obtained:  Disposition Patient care signed out at shift change to [place], [place] with pending CT soft tissue neck.  This chart was dictated using voice recognition software.  Despite best efforts to proofread,  errors can occur which can change the documentation meaning.          Final Clinical Impression(s) / ED Diagnoses Final diagnoses:  None    Rx / DC Orders ED Discharge Orders     None         Jeanelle Malling, [place] 01/06/23 1520    Pricilla Loveless, MD 01/10/23 [phone]

## 2023-01-06 NOTE — ED Notes (Signed)
Pt denies any fevers. Pt is constantly cold so endorses feeling chilly.

## 2023-01-06 NOTE — ED Provider Notes (Signed)
  Accepted handoff at shift change from Jeanelle Malling PA-C. Please see prior provider note for more detail.   Briefly: Patient is 27 y.o. "presents today for evaluation of sore throat.  Patient reports that he can see an abscess in his throat.  He endorses cold/chills.  Denies any fever.  Denies any issue with eating, drinking or breathing.  He was given clindamycin for the abscess however he reports no improvement."  DDX: concern for  strep, peritonsillar abscess, retropharyngeal abscess, epiglottitis infectious etiology   Plan:  - Area of swelling and draining has been present for 6 days. Patient started on Azithromycin on the first day which was changed to Clindamycin Friday afternoon. States that there has been no resolution in swelling. No difficulty swallowing or breathing - is spitting in bag because patient states that he can taste the purulence.  - obtaining swab of area for culture.  - CT showing small (9 x 7 x 5 mm) peripherally enhancing fluid collection along the superficial/mucosal surface of the right lateral oropharyngeal wall at the inferior aspect of the right palatine tonsil. This could represent a small abscess or cyst (potentially infected). While thought less likely, recommend correlation with direct inspection to exclude malignancy. - Consulted with ENT (Dr. Irene Pap) who recommends Augmentin and steroids and would like to see patient in office tomorrow morning. Shared information with patient who verbalized understanding of plan.  - patient stating that he had an allergic reaction to penicillin as an infant - is not able to provide the reaction. Patient talked with his father who is also not able to remember the allergic reaction that patient had to penicillin. Provided patient with a dose of Augmentin ED which he tolerated well. Sent rest of prescription to pharmacy. - patient afebrile with stable vitals. Provided with return precautions. Discharged in good condition. Answered  patient's questions.       Dorthy Cooler, [place] 01/06/23 1914    Pricilla Loveless, MD 01/10/23 [phone]

## 2023-01-07 ENCOUNTER — Encounter (INDEPENDENT_AMBULATORY_CARE_PROVIDER_SITE_OTHER): Payer: Self-pay | Admitting: Otolaryngology

## 2023-01-07 ENCOUNTER — Ambulatory Visit (INDEPENDENT_AMBULATORY_CARE_PROVIDER_SITE_OTHER): Payer: Commercial Managed Care - HMO | Admitting: Otolaryngology

## 2023-01-07 VITALS — BP 138/82 | HR 69 | Ht 74.0 in | Wt 197.0 lb

## 2023-01-07 DIAGNOSIS — J3501 Chronic tonsillitis: Secondary | ICD-10-CM

## 2023-01-07 DIAGNOSIS — R1310 Dysphagia, unspecified: Secondary | ICD-10-CM | POA: Diagnosis not present

## 2023-01-07 DIAGNOSIS — J358 Other chronic diseases of tonsils and adenoids: Secondary | ICD-10-CM | POA: Diagnosis not present

## 2023-01-07 DIAGNOSIS — J312 Chronic pharyngitis: Secondary | ICD-10-CM | POA: Diagnosis not present

## 2023-01-07 DIAGNOSIS — J351 Hypertrophy of tonsils: Secondary | ICD-10-CM

## 2023-01-07 DIAGNOSIS — K219 Gastro-esophageal reflux disease without esophagitis: Secondary | ICD-10-CM

## 2023-01-07 DIAGNOSIS — R0981 Nasal congestion: Secondary | ICD-10-CM

## 2023-01-07 MED ORDER — CETIRIZINE HCL 10 MG PO TABS: 10.0000 mg | ORAL_TABLET | Freq: Every day | ORAL | 11 refills | Status: DC

## 2023-01-07 NOTE — Patient Instructions (Addendum)
-   finish steroids and Augmentin  - gargle with salt water  - continue Protonix  - continue Flonase twice daily - start Zyrtec - nasal saline rinses  - return in 4 weeks

## 2023-01-07 NOTE — Progress Notes (Signed)
ENT CONSULT:  Reason for Consult: Right tonsillar collection with drainage  HPI: Dave Hernandez is an 27 y.o. male with hx of long-standing sore throat and a cyst, that appears to drain on the right side of his throat. At times he is able to taste the drainage, when it drains on its own. He reports that 1-2 months ago he developed left ear pressure, went to ED and at first was diagnosed with an ear infection. It started to clear up. He then developed an abscess right lower mandibular area, cleared with Clindamycin and it was thought to be dental in etiology. Then his left ear pain returned. Two weeks ago he developed sore throat again, would get better with Aleve. His sore throat became more persistent later on, and when he looked at his throat himself he noticed the "abscess" on the right side of his throat. He was given Z-pack. The next day he went to urgent care 2/2 persistent sx, and was told to take Clindamycin. He was seen in ED yesterday and was started on prednisone and Augmentin.  He denies fever, he denies trouble with swallowing. He feels his voice is hoarse. Denies poor dentiition.     Records Reviewed:  ED note from yesterday Briefly: Patient is 27 y.o. "presents today for evaluation of sore throat.  Patient reports that he can see an abscess in his throat.  He endorses cold/chills.  Denies any fever.  Denies any issue with eating, drinking or breathing.  He was given clindamycin for the abscess however he reports no improvement."   DDX: concern for  strep, peritonsillar abscess, retropharyngeal abscess, epiglottitis infectious etiology    Plan:  - Area of swelling and draining has been present for 6 days. Patient started on Azithromycin on the first day which was changed to Clindamycin Friday afternoon. States that there has been no resolution in swelling. No difficulty swallowing or breathing - is spitting in bag because patient states that he can taste the purulence.  - obtaining  swab of area for culture.  - CT showing small (9 x 7 x 5 mm) peripherally enhancing fluid collection along the superficial/mucosal surface of the right lateral oropharyngeal wall at the inferior aspect of the right palatine tonsil. This could represent a small abscess or cyst (potentially infected). While thought less likely, recommend correlation with direct inspection to exclude malignancy. - Consulted with ENT (Dr. Irene Pap) who recommends Augmentin and steroids and would like to see patient in office tomorrow morning. Shared information with patient who verbalized understanding of plan.  - patient stating that he had an allergic reaction to penicillin as an infant - is not able to provide the reaction. Patient talked with his father who is also not able to remember the allergic reaction that patient had to penicillin. Provided patient with a dose of Augmentin ED which he tolerated well. Sent rest of prescription to pharmacy. - patient afebrile with stable vitals. Provided with return precautions. Discharged in good condition. Answered patient's questions.     Past Medical History:  Diagnosis Date   Anxiety    Asthma    COVID-19    Depression    Kidney stones    Pneumonia     Past Surgical History:  Procedure Laterality Date   NO PAST SURGERIES      Family History  Problem Relation Age of Onset   Heart attack Mother    Diabetes Father    Heart failure Maternal Grandfather    Breast cancer  Paternal Grandmother    Diabetes Paternal Grandfather    Prostate cancer Paternal Uncle    Diabetes Paternal Uncle     Social History:  reports that he quit smoking about 6 years ago. His smoking use included cigarettes. He has never used smokeless tobacco. He reports current alcohol use. He reports current drug use. Drug: Marijuana.  Allergies:  Allergies  Allergen Reactions   Penicillins Other (See Comments)    Childhood allergy - Patient tolerated Augmentin well in ED without signs of  anaphylaxis or any other allergic reaction.     Medications: I have reviewed the patient's current medications.  The PMH, PSH, Medications, Allergies, and SH were reviewed and updated.  ROS: Constitutional: Negative for fever, weight loss and weight gain. Cardiovascular: Negative for chest pain and dyspnea on exertion. Respiratory: Is not experiencing shortness of breath at rest. Gastrointestinal: Negative for nausea and vomiting. Neurological: Negative for headaches. Psychiatric: The patient is not nervous/anxious  Blood pressure 138/82, pulse 69, height 6\' 2"  (1.88 m), weight 197 lb (89.4 kg), SpO2 98%.  PHYSICAL EXAM:  Exam: General: Well-developed, well-nourished Respiratory Respiratory effort: Equal inspiration and expiration without stridor Cardiovascular Peripheral Vascular: Warm extremities with equal color/perfusion Eyes: No nystagmus with equal extraocular motion bilaterally Neuro/Psych/Balance: Patient oriented to person, place, and time; Appropriate mood and affect; Gait is intact with no imbalance; Cranial nerves I-XII are intact Head and Face Inspection: Normocephalic and atraumatic without mass or lesion Palpation: Facial skeleton intact without bony stepoffs Salivary Glands: No mass or tenderness Facial Strength: Facial motility symmetric and full bilaterally ENT Pinna: External ear intact and fully developed External canal: Canal is patent with intact skin Tympanic Membrane: Clear and mobile External Nose: No scar or anatomic deformity Internal Nose: Septum is deviated to the left. No polyp, or purulence. Mucosal edema and erythema present.  Bilateral inferior turbinate hypertrophy.  Lips, Teeth, and gums: Mucosa and teeth intact and viable TMJ: No pain to palpation with full mobility Oral cavity/oropharynx: No exudate, no lesions present. 2-3+ cryptic tonsils with erythema Nasopharynx: No mass or lesion with intact mucosa Hypopharynx: Intact mucosa without  pooling of secretions Larynx Glottic: Full true vocal cord mobility without lesion or mass Supraglottic: Normal appearing epiglottis and AE folds Interarytenoid Space: Moderate pachydermia edema Subglottic Space: Patent without lesion or edema Neck Neck and Trachea: Midline trachea without mass or lesion Thyroid: No mass or nodularity Lymphatics: No lymphadenopathy  Procedure: Preoperative diagnosis: Chronic sore throat pain with swallowing concern for tonsillar abscess  Postoperative diagnosis:   Same + tonsillar cyst no evidence of other masses or tumors or signs of tonsillar or peritonsillar abscess on exam  Procedure: Flexible fiberoptic laryngoscopy  Surgeon: Ashok Croon, MD  Anesthesia: Topical lidocaine and Afrin Complications: None Condition is stable throughout exam  Indications and consent:  The patient presents to the clinic with chronic sore throat Indirect laryngoscopy view was incomplete. Thus it was recommended that they undergo a flexible fiberoptic laryngoscopy. All of the risks, benefits, and potential complications were reviewed with the patient preoperatively and verbal informed consent was obtained.  Procedure: The patient was seated upright in the clinic. Topical lidocaine and Afrin were applied to the nasal cavity. After adequate anesthesia had occurred, I then proceeded to pass the flexible telescope into the nasal cavity. The nasal cavity was patent without rhinorrhea or polyp. The nasopharynx was also patent without mass or lesion. The base of tongue was visualized and was normal. There were no signs of pooling of secretions  in the piriform sinuses. The true vocal folds were mobile bilaterally. There were no signs of glottic or supraglottic mucosal lesion or mass. There was moderate interarytenoid pachydermia and post cricoid edema. The telescope was then slowly withdrawn and the patient tolerated the procedure throughout.       Studies Reviewed: CT  neck with contrast yesterday  FINDINGS: Pharynx and larynx: Small (9 x 7 x 5 mm) peripherally enhancing fluid collection along the superficial/mucosal surface of the right lateral oropharyngeal wall at the inferior aspect of the right palatine tonsil (see series 3, image 62 and series 6, image 32).   Salivary glands: No inflammation, mass, or stone.   Thyroid: Normal.   Lymph nodes: None enlarged or abnormal density.   Vascular: Not well evaluated on this study due to non arterial contrast timing.   Limited intracranial: Negative.   Visualized orbits: Negative.   Mastoids and visualized paranasal sinuses: Clear.   Skeleton: No acute or aggressive process.   Upper chest: Visualized lung apices are clear.   IMPRESSION: Small (9 x 7 x 5 mm) peripherally enhancing fluid collection along the superficial/mucosal surface of the right lateral oropharyngeal wall at the inferior aspect of the right palatine tonsil. This could represent a small abscess or cyst (potentially infected). While thought less likely, recommend correlation with direct inspection to exclude malignancy.   Assessment/Plan: Encounter Diagnoses  Name Primary?   Tonsillar hypertrophy Yes   Gastroesophageal reflux disease without esophagitis [K21.9]    Chronic sore throat [J31.2]    Cyst of tonsil [J35.8]    Chronic tonsillitis    Chronic sore throat pain with swallowing concern for tonsillar abscess.  I reviewed CT neck imaging and it appears to have well circumcised mildly enhancing lesion next to the inferior area of the right tonsil which is consistent with right tonsillar cyst seen on flexible laryngoscopy and oropharyngeal exam today  2.  Chronic tonsillitis history of tonsillar hypertrophy -I suspect most of his sore throat and throat pain symptoms are due to chronic tonsillitis and we discussed tonsillectomy -Risks and benefits of tonsillectomy were discussed today he will consider in the future if  symptoms persist -He will finish Augmentin and steroid course given to him in ED yesterday -We discussed how management of postnasal drainage and GERD LPR can improve sore throat symptoms -Gargle with salt water  3.  GERD LPR -Continue Pepcid 20 mg daily -Diet and lifestyle changes to minimize reflux  4.  Nasal congestion suspect environmental allergies -Continue daily antihistamine and Flonase 2 puffs twice daily bilateral nares  -He will return in a few weeks for symptom check and to discuss tonsillectomy if indicated at the time   Thank you for allowing me to participate in the care of this patient. Please do not hesitate to contact me with any questions or concerns.   Ashok Croon, MD Otolaryngology [facility] Phone: [phone] Fax: [phone]    01/07/2023, 8:54 PM

## 2023-01-09 LAB — AEROBIC CULTURE W GRAM STAIN (SUPERFICIAL SPECIMEN)

## 2023-01-10 ENCOUNTER — Telehealth (HOSPITAL_BASED_OUTPATIENT_CLINIC_OR_DEPARTMENT_OTHER): Payer: Self-pay

## 2023-01-10 NOTE — Telephone Encounter (Signed)
Post ED Visit - Positive Culture Follow-up  Culture report reviewed by antimicrobial stewardship pharmacist: [facility] [x]  Ivery Quale, [place].D. []  Celedonio Miyamoto, Pharm.D., BCPS AQ-ID []  Garvin Fila, Pharm.D., BCPS []  Georgina Pillion, [address].D., BCPS []  [place], [place].D., BCPS, AAHIVP []  Estella Husk, Pharm.D., BCPS, AAHIVP []  Lysle Pearl, PharmD, BCPS []  Phillips Climes, PharmD, BCPS []  Agapito Games, PharmD, BCPS []  Verlan Friends, PharmD []  Mervyn Gay, PharmD, BCPS []  Vinnie Level, PharmD  [facility] []  Len Childs, PharmD []  Greer Pickerel, PharmD []  Adalberto Cole, PharmD []  Perlie Gold, Rph []  Lonell Face) Jean Rosenthal, PharmD []  Earl Many, PharmD []  Junita Push, PharmD []  Dorna Leitz, PharmD []  Terrilee Files, PharmD []  Lynann Beaver, PharmD []  Keturah Barre, PharmD []  Loralee Pacas, PharmD []  Bernadene Person, PharmD   Positive throat culture Treated with Amoxicillin, Azithromycin, and Clindamycin, organism sensitive to the same and no further patient follow-up is required at this time.  Sandria Senter 01/10/2023, 9:52 AM

## 2023-01-12 ENCOUNTER — Emergency Department (HOSPITAL_COMMUNITY): Payer: Commercial Managed Care - HMO

## 2023-01-12 ENCOUNTER — Emergency Department (HOSPITAL_COMMUNITY)
Admission: EM | Admit: 2023-01-12 | Discharge: 2023-01-12 | Disposition: A | Discharge status: Home / Self Care | Payer: Commercial Managed Care - HMO | Attending: Emergency Medicine | Admitting: Emergency Medicine

## 2023-01-12 ENCOUNTER — Encounter (HOSPITAL_COMMUNITY): Payer: Self-pay

## 2023-01-12 ENCOUNTER — Other Ambulatory Visit: Payer: Self-pay

## 2023-01-12 DIAGNOSIS — J36 Peritonsillar abscess: Secondary | ICD-10-CM | POA: Diagnosis present

## 2023-01-12 MED ORDER — IOHEXOL 350 MG/ML SOLN
75.0000 mL | Freq: Once | INTRAVENOUS | Status: AC | PRN
  Administered 2023-01-12: 75 mL via INTRAVENOUS

## 2023-01-12 MED ORDER — NAPROXEN 375 MG PO TABS: 375.0000 mg | ORAL_TABLET | Freq: Two times a day (BID) | ORAL | 0 refills | Status: DC

## 2023-01-12 NOTE — ED Triage Notes (Signed)
Pt has a known tonsillar abscess that he was diagnosed with on 10/20, pt currently taking an abx for. Pt was told to come back if he experienced worsening pain and swelling. Pt c.o worsening pain to his right jaw and states he now feels like there is a knot on his neck with increased pain. No issues swallowing or breathing. Airway intact

## 2023-01-12 NOTE — Discharge Instructions (Addendum)
Continue the antibiotics as prescribed.  You can take the anti-inflammatory medications now as you are no longer taking the steroids.  Follow-up with ENT doctor as planned.

## 2023-01-12 NOTE — ED Provider Notes (Addendum)
[place] EMERGENCY DEPARTMENT AT [facility] Provider Note   CSN: [id number] Arrival date & time: 01/12/23  8469     History  Chief Complaint  Patient presents with   Abscess    Dave Hernandez is a 27 y.o. male.   Abscess    Patient has a history of depression and anxiety kidney stones pneumonia and a recent diagnosis of a tonsillar abscess.  Patient was seen in the emergency room on October 20.  He was complaining of sore throat and had a CT scan of his neck.  The CT scan showed a small less than 1 cm fluid collection along the lateral oropharyngeal wall at the inferior aspect of the right palate teen tonsil.  Patient followed up with ENT Dr. Melba Coon on October 21.  Plan was to continue Augmentin and steroids.  No indication for incision and drainage patient was instructed if any worsening symptoms to return to the ED.  Patient states this morning he felt like he felt the bump on the side of his neck.  He also felt like there may have been some swelling on the right side of his face and he was having some headache and discomfort on the right side of his head.  He has not had any fevers or chills.  He is not having any difficulty swallowing.  Home Medications Prior to Admission medications   Medication Sig Start Date End Date Taking? Authorizing Provider  acetaminophen (TYLENOL) 500 MG tablet Take 500 mg by mouth every 6 (six) hours as needed for moderate pain (pain score 4-6) or mild pain (pain score 1-3).    [provider]  amoxicillin-clavulanate (AUGMENTIN) 875-125 MG tablet Take 1 tablet by mouth every 12 (twelve) hours for 14 days. 01/06/23 01/20/23  Dorthy Cooler, PA-C  cetirizine (ZYRTEC) 10 MG tablet Take 1 tablet (10 mg total) by mouth daily. 01/07/23   Ashok Croon, MD  fluticasone (FLONASE) 50 MCG/ACT nasal spray Place 1 spray into both nostrils daily. 12/04/22   [provider]  naproxen (NAPROSYN) 375 MG tablet Take 1  tablet (375 mg total) by mouth 2 (two) times daily. 01/12/23   Linwood Dibbles, MD  pantoprazole (PROTONIX) 40 MG tablet Take 1 tablet (40 mg total) by mouth daily. 10/20/22   Melton Alar R, PA-C      Allergies    Penicillins    Review of Systems   Review of Systems  Physical Exam Updated Vital Signs BP 134/82   Pulse (!) 55   Temp 98.7 F (37.1 C) (Oral)   Resp 15   Ht 1.88 m (6\' 2" )   Wt 89.3 kg   SpO2 98%   BMI 25.28 kg/m  Physical Exam Vitals and nursing note reviewed.  Constitutional:      General: He is not in acute distress.    Appearance: He is well-developed.  HENT:     Head: Normocephalic and atraumatic.     Comments: No facial erythema or edema noted    Right Ear: External ear normal.     Left Ear: External ear normal.     Mouth/Throat:     Pharynx: No oropharyngeal exudate or posterior oropharyngeal erythema.     Comments: Mild edema noted of the right peritonsillar area compared to the left, the uvula is midline, tonsils are not touching Eyes:     General: No scleral icterus.       Right eye: No discharge.  Left eye: No discharge.     Conjunctiva/sclera: Conjunctivae normal.  Neck:     Trachea: No tracheal deviation.  Cardiovascular:     Rate and Rhythm: Normal rate and regular rhythm.  Pulmonary:     Effort: Pulmonary effort is normal. No respiratory distress.     Breath sounds: Normal breath sounds. No stridor. No wheezing or rales.  Abdominal:     Palpations: Abdomen is soft.  Musculoskeletal:     Cervical back: Neck supple.  Skin:    General: Skin is warm and dry.     Findings: No rash.  Neurological:     General: No focal deficit present.     Mental Status: He is alert.     Cranial Nerves: No cranial nerve deficit, dysarthria or facial asymmetry.     Sensory: No sensory deficit.     Motor: No abnormal muscle tone or seizure activity.     Coordination: Coordination normal.  Psychiatric:        Mood and Affect: Mood normal.     ED  Results / Procedures / Treatments   Labs (all labs ordered are listed, but only abnormal results are displayed) Labs Reviewed - No data to display  EKG None  Radiology CT Soft Tissue Neck W Contrast  Result Date: 01/12/2023 CLINICAL DATA:  27 year old male with recent asymmetric palatine tonsil on neck CT, sore throat. No improvement. EXAM: CT NECK WITH CONTRAST TECHNIQUE: Multidetector CT imaging of the neck was performed using the standard protocol following the bolus administration of intravenous contrast. RADIATION DOSE REDUCTION: This exam was performed according to the departmental dose-optimization program which includes automated exposure control, adjustment of the mA and/or kV according to patient size and/or use of iterative reconstruction technique. CONTRAST:  75mL OMNIPAQUE IOHEXOL 350 MG/ML SOLN COMPARISON:  Neck CT 01/06/2023. FINDINGS: Pharynx and larynx: Larynx, epiglottis remain normal. Unchanged mild asymmetry of the lower right palatine tonsil on series 3, image 58, with otherwise bilateral oropharyngeal soft tissue contours and enhancement within normal limits. No parapharyngeal or retropharyngeal space inflammation, edema. Salivary glands: Stable, negative. Thyroid: Stable, negative. Lymph nodes: Stable and within normal limits. Subcentimeter lymph nodes with no heterogeneity. No convincing cervical lymphadenopathy. Vascular: Major vascular structures in the neck and at the skull base appear patent and normal. Limited intracranial: Negative. Visualized orbits: Negative. Mastoids and visualized paranasal sinuses: Visualized paranasal sinuses and mastoids are stable and well aerated. Skeleton: Absent posterior dentition. No acute dental finding. No acute or suspicious osseous lesion. Upper chest: Stable, negative. IMPRESSION: 1. Unchanged mild asymmetry of the right lower palatine tonsil, which is more likely a benign retention cyst rather than sequelae of tonsillar infection given  absence of parapharyngeal inflammation, lymphadenopathy, or other acute findings. 2. Otherwise stable and negative CT appearance of the Neck. Electronically Signed   By: Odessa Fleming M.D.   On: 01/12/2023 12:52    Procedures Procedures    Medications Ordered in ED Medications  iohexol (OMNIPAQUE) 350 MG/ML injection 75 mL (75 mLs Intravenous Contrast Given 01/12/23 1206)    ED Course/ Medical Decision Making/ A&P Clinical Course as of 01/12/23 1325  Sat Jan 12, 2023  1100 Pt now requests the CT SCan  [JK]  1320 1. Unchanged mild asymmetry of the right lower palatine tonsil, which is more likely a benign retention cyst rather than sequelae of tonsillar infection given absence of parapharyngeal inflammation, lymphadenopathy, or other acute findings.   [JK]    Clinical Course User Index [JK] Linwood Dibbles,  MD                                 Medical Decision Making Amount and/or Complexity of Data Reviewed Radiology: ordered.  Risk Prescription drug management.   Patient presents for reevaluation after recent diagnosis of peritonsillar abscess.  On my exam the patient has very mild swelling of the right peritonsillar area.  There is no significant edema.  He does not have any uvula deviation.  He is not having any difficulty with his speech.  He does not have any trismus.  I do not appreciate any significant lymphadenopathy in the anterior cervical chain of the submandibular region.  I do not feel that patient requires repeat CT scan imaging or incision and drainage.  He is still taking his antibiotics and I recommend he continue that.  We did discuss repeat CT scanning.  I offered to repeat that if the patient was very concerned but he was reassured after our discussion.  Evaluation and diagnostic testing in the emergency department does not suggest an emergent condition requiring admission or immediate intervention beyond what has been performed at this time.  The patient is safe for  discharge and has been instructed to return immediately for worsening symptoms, change in symptoms or any other concerns.  Before leaving the patient decided that he did not want to have the CT scan.  CT scan findings reviewed and is not show any signs of worsening infection.  In fact findings suggest he may have a retention cyst rather than an abscess.  Reassured patient that he is stable for discharge there are no signs of infection that is spreading..  Patient can continue treatment as an outpatient   Final Clinical Impression(s) / ED Diagnoses Final diagnoses:  Abscess, peritonsillar    Rx / DC Orders ED Discharge Orders          Ordered    naproxen (NAPROSYN) 375 MG tablet  2 times daily,   Status:  Discontinued        01/12/23 1036    naproxen (NAPROSYN) 375 MG tablet  2 times daily        01/12/23 1325              Linwood Dibbles, MD 01/12/23 1036    Linwood Dibbles, MD 01/12/23 1326

## 2023-01-12 NOTE — ED Notes (Signed)
Refused lunch tray at this time. Will continue to monitor. Police at bedside. Safety precautions maintained.

## 2023-01-21 ENCOUNTER — Encounter (INDEPENDENT_AMBULATORY_CARE_PROVIDER_SITE_OTHER): Payer: Self-pay | Admitting: Otolaryngology

## 2023-01-21 ENCOUNTER — Ambulatory Visit (INDEPENDENT_AMBULATORY_CARE_PROVIDER_SITE_OTHER): Payer: Managed Care, Other (non HMO) | Admitting: Otolaryngology

## 2023-01-21 VITALS — BP 148/82 | HR 66

## 2023-01-21 DIAGNOSIS — J312 Chronic pharyngitis: Secondary | ICD-10-CM

## 2023-01-21 DIAGNOSIS — K219 Gastro-esophageal reflux disease without esophagitis: Secondary | ICD-10-CM

## 2023-01-21 DIAGNOSIS — M542 Cervicalgia: Secondary | ICD-10-CM

## 2023-01-21 DIAGNOSIS — J358 Other chronic diseases of tonsils and adenoids: Secondary | ICD-10-CM | POA: Diagnosis not present

## 2023-01-21 DIAGNOSIS — J3501 Chronic tonsillitis: Secondary | ICD-10-CM

## 2023-01-21 DIAGNOSIS — J351 Hypertrophy of tonsils: Secondary | ICD-10-CM

## 2023-01-21 MED ORDER — FAMOTIDINE 20 MG PO TABS: 20.0000 mg | ORAL_TABLET | Freq: Two times a day (BID) | ORAL | 1 refills | Status: DC

## 2023-01-21 NOTE — H&P (View-Only) (Signed)
 ENT Progress Note:  Update 01/21/23: Returns for follow-up.  Finished antibiotics.  He had another visit to the ER 01/12/2023 due to recurrent throat discomfort and sensation of drainage from the lower right tonsillar cyst.  He also had recurrent swelling along the right face and neck pain on the right side.  He admits to doing physical work prior to onset of neck discomfort. He was started on Naproxen. No dental pain or infected teeth.  While in ED he had overall unremarkable CT neck with contrast which did not demonstrate peritonsillar abscess but did show previously seen right tonsillar cyst.   Records reviewed  ED visit note 01/12/2023 Patient has a history of depression and anxiety kidney stones pneumonia and a recent diagnosis of a tonsillar abscess. Patient was seen in the emergency room on October 20. He was complaining of sore throat and had a CT scan of his neck. The CT scan showed a small less than 1 cm fluid collection along the lateral oropharyngeal wall at the inferior aspect of the right palate teen tonsil. Patient followed up with ENT Dr. Melba Coon on October 21. Plan was to continue Augmentin and steroids. No indication for incision and drainage patient was instructed if any worsening symptoms to return to the ED. Patient states this morning he felt like he felt the bump on the side of his neck. He also felt like there may have been some swelling on the right side of his face and he was having some headache and discomfort on the right side of his head. He has not had any fevers or chills. He is not having any difficulty swallowing.   Initial evaluation by me Reason for Consult: Right tonsillar collection with drainage  HPI: Dave Hernandez is an 27 y.o. male with hx of long-standing sore throat and a cyst, that appears to drain on the right side of his throat. At times he is able to taste the drainage, when it drains on its own. He reports that 1-2 months ago he developed left ear  pressure, went to ED and at first was diagnosed with an ear infection. It started to clear up. He then developed an abscess right lower mandibular area, cleared with Clindamycin and it was thought to be dental in etiology. Then his left ear pain returned. Two weeks ago he developed sore throat again, would get better with Aleve. His sore throat became more persistent later on, and when he looked at his throat himself he noticed the "abscess" on the right side of his throat. He was given Z-pack. The next day he went to urgent care 2/2 persistent sx, and was told to take Clindamycin. He was seen in ED yesterday and was started on prednisone and Augmentin.  He denies fever, he denies trouble with swallowing. He feels his voice is hoarse. Denies poor dentiition.     Records Reviewed:  ED note from yesterday Briefly: Patient is 27 y.o. "presents today for evaluation of sore throat.  Patient reports that he can see an abscess in his throat.  He endorses cold/chills.  Denies any fever.  Denies any issue with eating, drinking or breathing.  He was given clindamycin for the abscess however he reports no improvement."   DDX: concern for  strep, peritonsillar abscess, retropharyngeal abscess, epiglottitis infectious etiology    Plan:  - Area of swelling and draining has been present for 6 days. Patient started on Azithromycin on the first day which was changed to Clindamycin Friday afternoon.  States that there has been no resolution in swelling. No difficulty swallowing or breathing - is spitting in bag because patient states that he can taste the purulence.  - obtaining swab of area for culture.  - CT showing small (9 x 7 x 5 mm) peripherally enhancing fluid collection along the superficial/mucosal surface of the right lateral oropharyngeal wall at the inferior aspect of the right palatine tonsil. This could represent a small abscess or cyst (potentially infected). While thought less likely, recommend correlation  with direct inspection to exclude malignancy. - Consulted with ENT (Dr. Irene Pap) who recommends Augmentin and steroids and would like to see patient in office tomorrow morning. Shared information with patient who verbalized understanding of plan.  - patient stating that he had an allergic reaction to penicillin as an infant - is not able to provide the reaction. Patient talked with his father who is also not able to remember the allergic reaction that patient had to penicillin. Provided patient with a dose of Augmentin ED which he tolerated well. Sent rest of prescription to pharmacy. - patient afebrile with stable vitals. Provided with return precautions. Discharged in good condition. Answered patient's questions.     Past Medical History:  Diagnosis Date   Anxiety    Asthma    COVID-19    Depression    Kidney stones    Pneumonia     Past Surgical History:  Procedure Laterality Date   NO PAST SURGERIES      Family History  Problem Relation Age of Onset   Heart attack Mother    Diabetes Father    Heart failure Maternal Grandfather    Breast cancer Paternal Grandmother    Diabetes Paternal Grandfather    Prostate cancer Paternal Uncle    Diabetes Paternal Uncle     Social History:  reports that he quit smoking about 6 years ago. His smoking use included cigarettes. He has never used smokeless tobacco. He reports current alcohol use. He reports current drug use. Drug: Marijuana.  Allergies:  Allergies  Allergen Reactions   Penicillins Other (See Comments)    Childhood allergy - Patient tolerated Augmentin well in ED without signs of anaphylaxis or any other allergic reaction.     Medications: I have reviewed the patient's current medications.  The PMH, PSH, Medications, Allergies, and SH were reviewed and updated.  ROS: Constitutional: Negative for fever, weight loss and weight gain. Cardiovascular: Negative for chest pain and dyspnea on exertion. Respiratory: Is not  experiencing shortness of breath at rest. Gastrointestinal: Negative for nausea and vomiting. Neurological: Negative for headaches. Psychiatric: The patient is not nervous/anxious  Blood pressure (!) 148/82, pulse 66, SpO2 98%.  PHYSICAL EXAM:  Exam: General: Well-developed, well-nourished Respiratory Respiratory effort: Equal inspiration and expiration without stridor Cardiovascular Peripheral Vascular: Warm extremities with equal color/perfusion Eyes: No nystagmus with equal extraocular motion bilaterally Neuro/Psych/Balance: Patient oriented to person, place, and time; Appropriate mood and affect; Gait is intact with no imbalance; Cranial nerves I-XII are intact Head and Face Inspection: Normocephalic and atraumatic without mass or lesion Palpation: Facial skeleton intact without bony stepoffs Salivary Glands: No mass or tenderness Facial Strength: Facial motility symmetric and full bilaterally ENT Pinna: External ear intact and fully developed External canal: Canal is patent with intact skin Tympanic Membrane: Clear and mobile External Nose: No scar or anatomic deformity Internal Nose: Septum is deviated to the left. No polyp, or purulence. Mucosal edema and erythema present.  Bilateral inferior turbinate hypertrophy.  Lips, Teeth,  and gums: Mucosa and teeth intact and viable TMJ: No pain to palpation with full mobility Oral cavity/oropharynx: No exudate, no lesions present. 2-3+ cryptic tonsils with erythema and right inferior tonsillar cyst seen transorally and on scope exam without drainage Nasopharynx: No mass or lesion with intact mucosa Hypopharynx: Intact mucosa without pooling of secretions Larynx Glottic: Full true vocal cord mobility without lesion or mass Supraglottic: Normal appearing epiglottis and AE folds Interarytenoid Space: Moderate pachydermia edema Subglottic Space: Patent without lesion or edema Neck Neck and Trachea: Midline trachea without mass or  lesion Thyroid: No mass or nodularity Lymphatics: No lymphadenopathy  Procedure: Preoperative diagnosis: Chronic sore throat pain with swallowing right tonsillar cyst  Postoperative diagnosis:   Same + tonsillar cyst no evidence of other masses or tumors or signs of tonsillar or peritonsillar abscess on exam  Procedure: Flexible fiberoptic laryngoscopy  Surgeon: Ashok Croon, MD  Anesthesia: Topical lidocaine and Afrin Complications: None Condition is stable throughout exam  Indications and consent:  The patient presents to the clinic with chronic sore throat Indirect laryngoscopy view was incomplete. Thus it was recommended that they undergo a flexible fiberoptic laryngoscopy. All of the risks, benefits, and potential complications were reviewed with the patient preoperatively and verbal informed consent was obtained.  Procedure: The patient was seated upright in the clinic. Topical lidocaine and Afrin were applied to the nasal cavity. After adequate anesthesia had occurred, I then proceeded to pass the flexible telescope into the nasal cavity. The nasal cavity was patent without rhinorrhea or polyp. The nasopharynx was also patent without mass or lesion. The base of tongue was visualized and was normal. There were no signs of pooling of secretions in the piriform sinuses. The true vocal folds were mobile bilaterally. There were no signs of glottic or supraglottic mucosal lesion or mass. There was moderate interarytenoid pachydermia and post cricoid edema. The telescope was then slowly withdrawn and the patient tolerated the procedure throughout.   Studies Reviewed: CT neck with contrast 01/06/23  FINDINGS: Pharynx and larynx: Small (9 x 7 x 5 mm) peripherally enhancing fluid collection along the superficial/mucosal surface of the right lateral oropharyngeal wall at the inferior aspect of the right palatine tonsil (see series 3, image 62 and series 6, image 32).   Salivary glands:  No inflammation, mass, or stone.   Thyroid: Normal.   Lymph nodes: None enlarged or abnormal density.   Vascular: Not well evaluated on this study due to non arterial contrast timing.   Limited intracranial: Negative.   Visualized orbits: Negative.   Mastoids and visualized paranasal sinuses: Clear.   Skeleton: No acute or aggressive process.   Upper chest: Visualized lung apices are clear.   IMPRESSION: Small (9 x 7 x 5 mm) peripherally enhancing fluid collection along the superficial/mucosal surface of the right lateral oropharyngeal wall at the inferior aspect of the right palatine tonsil. This could represent a small abscess or cyst (potentially infected). While thought less likely, recommend correlation with direct inspection to exclude malignancy.  01/12/23 IMPRESSION: 1. Unchanged mild asymmetry of the right lower palatine tonsil, which is more likely a benign retention cyst rather than sequelae of tonsillar infection given absence of parapharyngeal inflammation, lymphadenopathy, or other acute findings. 2. Otherwise stable and negative CT appearance of the Neck  Assessment/Plan: Encounter Diagnoses  Name Primary?   Chronic tonsillitis Yes   Cyst of tonsil    Chronic sore throat [J31.2]    Gastroesophageal reflux disease without esophagitis [K21.9]    Tonsillar  hypertrophy    Cervicalgia     Chronic sore throat pain with swallowing concern for tonsillar abscess.  I reviewed CT neck imaging and it appears to have well circumcised mildly enhancing lesion next to the inferior area of the right tonsil which is consistent with right tonsillar cyst seen on flexible laryngoscopy and oropharyngeal exam today  2.  Chronic tonsillitis history of tonsillar hypertrophy -I suspect most of his sore throat and throat pain symptoms are due to chronic tonsillitis and we discussed tonsillectomy -Risks and benefits of tonsillectomy were discussed today he will consider in the  future if symptoms persist -He will finish Augmentin and steroid course given to him in ED yesterday -We discussed how management of postnasal drainage and GERD LPR can improve sore throat symptoms -Gargle with salt water  3.  GERD LPR -Continue Pepcid 20 mg daily -Diet and lifestyle changes to minimize reflux  4.  Nasal congestion suspect environmental allergies -Continue daily antihistamine and Flonase 2 puffs twice daily bilateral nares  -He will return in a few weeks for symptom check and to discuss tonsillectomy if indicated at the time  Update 01/21/23 Had another visit to ED for neck pain, persistent throat discomfort. Repeat CT neck with no abscess, evidence of previously seen right tonsillar cyst. Otherwise unremarkable.   Chronic sore throat - improved after abx, but still has sensation of tonsillar cyst draining mucus  2.  Chronic tonsillitis history of tonsillar hypertrophy -I suspect most of his sore throat and throat pain symptoms are due to chronic tonsillitis and we discussed tonsillectomy -Risks and benefits of tonsillectomy were discussed today and he would like to proceed - will book for tonsillectomy  3.  GERD LPR -Continue Pepcid 20 mg daily -Diet and lifestyle changes to minimize reflux - trial of reflux gourmet   4.  Nasal congestion suspect environmental allergies -Continue daily antihistamine and Flonase 2 puffs twice daily bilateral nares  5. Neck pain along right SCM area (superior aspect) no palpable masses on exam - likely musculoskeletal  - continue Naproxen as needed and consider massage therapy   6. Recurrent R facial swelling/pain - likely TMJ related - no salivary gland swelling or mass on exam today and good dentition  - we discussed preventative care and supportive care when pain swelling recurs  - no pathology in the area on CT neck x 2   Ashok Croon, MD Otolaryngology [facility] Phone: [phone] Fax:  [phone]    01/21/2023, 7:52 PM

## 2023-01-21 NOTE — Patient Instructions (Addendum)
-   continue with salt water - take Pepcid (Famotidine 20 mg BID) for reflux  - we can schedule your tonsillectomy when ready - Take Reflux Gourmet (natural supplement available on [employer]) to help with symptoms of chronic throat irritation  - take Advil Motrin for neck pain - it is likely related to muscle strain   TMJ (Temporomandibular Joint Syndrome) The temporomandibular (tem-[id number]-man-DIB-u-lur) joint (TMJ) acts like a sliding hinge, connecting your jawbone to your skull. You have one joint on each side of your jaw. TMJ disorders -- a type of temporomandibular disorder or TMD -- can cause pain in your jaw joint and in the muscles that control jaw movement.  The exact cause of a person's TMJ disorder is often difficult to determine. Your pain may be due to a combination of factors, such as genetics, arthritis or jaw injury. Some people who have jaw pain also tend to clench or grind their teeth (bruxism), although many people habitually clench or grind their teeth and never develop TMJ disorders.  In most cases, the pain and discomfort associated with TMJ disorders is temporary and can be relieved with self-managed care or nonsurgical treatments. This includes stress reduction, softer diet when the pain is present, anti-inflammatory pain medications such as Motrin and warm compresses.

## 2023-01-21 NOTE — Progress Notes (Signed)
ENT Progress Note:  Update 01/21/23: Returns for follow-up.  Finished antibiotics.  He had another visit to the ER 01/12/2023 due to recurrent throat discomfort and sensation of drainage from the lower right tonsillar cyst.  He also had recurrent swelling along the right face and neck pain on the right side.  He admits to doing physical work prior to onset of neck discomfort. He was started on Naproxen. No dental pain or infected teeth.  While in ED he had overall unremarkable CT neck with contrast which did not demonstrate peritonsillar abscess but did show previously seen right tonsillar cyst.   Records reviewed  ED visit note 01/12/2023 Patient has a history of depression and anxiety kidney stones pneumonia and a recent diagnosis of a tonsillar abscess. Patient was seen in the emergency room on October 20. He was complaining of sore throat and had a CT scan of his neck. The CT scan showed a small less than 1 cm fluid collection along the lateral oropharyngeal wall at the inferior aspect of the right palate teen tonsil. Patient followed up with ENT Dr. Melba Coon on October 21. Plan was to continue Augmentin and steroids. No indication for incision and drainage patient was instructed if any worsening symptoms to return to the ED. Patient states this morning he felt like he felt the bump on the side of his neck. He also felt like there may have been some swelling on the right side of his face and he was having some headache and discomfort on the right side of his head. He has not had any fevers or chills. He is not having any difficulty swallowing.   Initial evaluation by me Reason for Consult: Right tonsillar collection with drainage  HPI: Dave Hernandez is an 27 y.o. male with hx of long-standing sore throat and a cyst, that appears to drain on the right side of his throat. At times he is able to taste the drainage, when it drains on its own. He reports that 1-2 months ago he developed left ear  pressure, went to ED and at first was diagnosed with an ear infection. It started to clear up. He then developed an abscess right lower mandibular area, cleared with Clindamycin and it was thought to be dental in etiology. Then his left ear pain returned. Two weeks ago he developed sore throat again, would get better with Aleve. His sore throat became more persistent later on, and when he looked at his throat himself he noticed the "abscess" on the right side of his throat. He was given Z-pack. The next day he went to urgent care 2/2 persistent sx, and was told to take Clindamycin. He was seen in ED yesterday and was started on prednisone and Augmentin.  He denies fever, he denies trouble with swallowing. He feels his voice is hoarse. Denies poor dentiition.     Records Reviewed:  ED note from yesterday Briefly: Patient is 27 y.o. "presents today for evaluation of sore throat.  Patient reports that he can see an abscess in his throat.  He endorses cold/chills.  Denies any fever.  Denies any issue with eating, drinking or breathing.  He was given clindamycin for the abscess however he reports no improvement."   DDX: concern for  strep, peritonsillar abscess, retropharyngeal abscess, epiglottitis infectious etiology    Plan:  - Area of swelling and draining has been present for 6 days. Patient started on Azithromycin on the first day which was changed to Clindamycin Friday afternoon.  States that there has been no resolution in swelling. No difficulty swallowing or breathing - is spitting in bag because patient states that he can taste the purulence.  - obtaining swab of area for culture.  - CT showing small (9 x 7 x 5 mm) peripherally enhancing fluid collection along the superficial/mucosal surface of the right lateral oropharyngeal wall at the inferior aspect of the right palatine tonsil. This could represent a small abscess or cyst (potentially infected). While thought less likely, recommend correlation  with direct inspection to exclude malignancy. - Consulted with ENT (Dr. Irene Pap) who recommends Augmentin and steroids and would like to see patient in office tomorrow morning. Shared information with patient who verbalized understanding of plan.  - patient stating that he had an allergic reaction to penicillin as an infant - is not able to provide the reaction. Patient talked with his father who is also not able to remember the allergic reaction that patient had to penicillin. Provided patient with a dose of Augmentin ED which he tolerated well. Sent rest of prescription to pharmacy. - patient afebrile with stable vitals. Provided with return precautions. Discharged in good condition. Answered patient's questions.     Past Medical History:  Diagnosis Date   Anxiety    Asthma    COVID-19    Depression    Kidney stones    Pneumonia     Past Surgical History:  Procedure Laterality Date   NO PAST SURGERIES      Family History  Problem Relation Age of Onset   Heart attack Mother    Diabetes Father    Heart failure Maternal Grandfather    Breast cancer Paternal Grandmother    Diabetes Paternal Grandfather    Prostate cancer Paternal Uncle    Diabetes Paternal Uncle     Social History:  reports that he quit smoking about 6 years ago. His smoking use included cigarettes. He has never used smokeless tobacco. He reports current alcohol use. He reports current drug use. Drug: Marijuana.  Allergies:  Allergies  Allergen Reactions   Penicillins Other (See Comments)    Childhood allergy - Patient tolerated Augmentin well in ED without signs of anaphylaxis or any other allergic reaction.     Medications: I have reviewed the patient's current medications.  The PMH, PSH, Medications, Allergies, and SH were reviewed and updated.  ROS: Constitutional: Negative for fever, weight loss and weight gain. Cardiovascular: Negative for chest pain and dyspnea on exertion. Respiratory: Is not  experiencing shortness of breath at rest. Gastrointestinal: Negative for nausea and vomiting. Neurological: Negative for headaches. Psychiatric: The patient is not nervous/anxious  Blood pressure (!) 148/82, pulse 66, SpO2 98%.  PHYSICAL EXAM:  Exam: General: Well-developed, well-nourished Respiratory Respiratory effort: Equal inspiration and expiration without stridor Cardiovascular Peripheral Vascular: Warm extremities with equal color/perfusion Eyes: No nystagmus with equal extraocular motion bilaterally Neuro/Psych/Balance: Patient oriented to person, place, and time; Appropriate mood and affect; Gait is intact with no imbalance; Cranial nerves I-XII are intact Head and Face Inspection: Normocephalic and atraumatic without mass or lesion Palpation: Facial skeleton intact without bony stepoffs Salivary Glands: No mass or tenderness Facial Strength: Facial motility symmetric and full bilaterally ENT Pinna: External ear intact and fully developed External canal: Canal is patent with intact skin Tympanic Membrane: Clear and mobile External Nose: No scar or anatomic deformity Internal Nose: Septum is deviated to the left. No polyp, or purulence. Mucosal edema and erythema present.  Bilateral inferior turbinate hypertrophy.  Lips, Teeth,  and gums: Mucosa and teeth intact and viable TMJ: No pain to palpation with full mobility Oral cavity/oropharynx: No exudate, no lesions present. 2-3+ cryptic tonsils with erythema and right inferior tonsillar cyst seen transorally and on scope exam without drainage Nasopharynx: No mass or lesion with intact mucosa Hypopharynx: Intact mucosa without pooling of secretions Larynx Glottic: Full true vocal cord mobility without lesion or mass Supraglottic: Normal appearing epiglottis and AE folds Interarytenoid Space: Moderate pachydermia edema Subglottic Space: Patent without lesion or edema Neck Neck and Trachea: Midline trachea without mass or  lesion Thyroid: No mass or nodularity Lymphatics: No lymphadenopathy  Procedure: Preoperative diagnosis: Chronic sore throat pain with swallowing right tonsillar cyst  Postoperative diagnosis:   Same + tonsillar cyst no evidence of other masses or tumors or signs of tonsillar or peritonsillar abscess on exam  Procedure: Flexible fiberoptic laryngoscopy  Surgeon: Ashok Croon, MD  Anesthesia: Topical lidocaine and Afrin Complications: None Condition is stable throughout exam  Indications and consent:  The patient presents to the clinic with chronic sore throat Indirect laryngoscopy view was incomplete. Thus it was recommended that they undergo a flexible fiberoptic laryngoscopy. All of the risks, benefits, and potential complications were reviewed with the patient preoperatively and verbal informed consent was obtained.  Procedure: The patient was seated upright in the clinic. Topical lidocaine and Afrin were applied to the nasal cavity. After adequate anesthesia had occurred, I then proceeded to pass the flexible telescope into the nasal cavity. The nasal cavity was patent without rhinorrhea or polyp. The nasopharynx was also patent without mass or lesion. The base of tongue was visualized and was normal. There were no signs of pooling of secretions in the piriform sinuses. The true vocal folds were mobile bilaterally. There were no signs of glottic or supraglottic mucosal lesion or mass. There was moderate interarytenoid pachydermia and post cricoid edema. The telescope was then slowly withdrawn and the patient tolerated the procedure throughout.   Studies Reviewed: CT neck with contrast 01/06/23  FINDINGS: Pharynx and larynx: Small (9 x 7 x 5 mm) peripherally enhancing fluid collection along the superficial/mucosal surface of the right lateral oropharyngeal wall at the inferior aspect of the right palatine tonsil (see series 3, image 62 and series 6, image 32).   Salivary glands:  No inflammation, mass, or stone.   Thyroid: Normal.   Lymph nodes: None enlarged or abnormal density.   Vascular: Not well evaluated on this study due to non arterial contrast timing.   Limited intracranial: Negative.   Visualized orbits: Negative.   Mastoids and visualized paranasal sinuses: Clear.   Skeleton: No acute or aggressive process.   Upper chest: Visualized lung apices are clear.   IMPRESSION: Small (9 x 7 x 5 mm) peripherally enhancing fluid collection along the superficial/mucosal surface of the right lateral oropharyngeal wall at the inferior aspect of the right palatine tonsil. This could represent a small abscess or cyst (potentially infected). While thought less likely, recommend correlation with direct inspection to exclude malignancy.  01/12/23 IMPRESSION: 1. Unchanged mild asymmetry of the right lower palatine tonsil, which is more likely a benign retention cyst rather than sequelae of tonsillar infection given absence of parapharyngeal inflammation, lymphadenopathy, or other acute findings. 2. Otherwise stable and negative CT appearance of the Neck  Assessment/Plan: Encounter Diagnoses  Name Primary?   Chronic tonsillitis Yes   Cyst of tonsil    Chronic sore throat [J31.2]    Gastroesophageal reflux disease without esophagitis [K21.9]    Tonsillar  hypertrophy    Cervicalgia     Chronic sore throat pain with swallowing concern for tonsillar abscess.  I reviewed CT neck imaging and it appears to have well circumcised mildly enhancing lesion next to the inferior area of the right tonsil which is consistent with right tonsillar cyst seen on flexible laryngoscopy and oropharyngeal exam today  2.  Chronic tonsillitis history of tonsillar hypertrophy -I suspect most of his sore throat and throat pain symptoms are due to chronic tonsillitis and we discussed tonsillectomy -Risks and benefits of tonsillectomy were discussed today he will consider in the  future if symptoms persist -He will finish Augmentin and steroid course given to him in ED yesterday -We discussed how management of postnasal drainage and GERD LPR can improve sore throat symptoms -Gargle with salt water  3.  GERD LPR -Continue Pepcid 20 mg daily -Diet and lifestyle changes to minimize reflux  4.  Nasal congestion suspect environmental allergies -Continue daily antihistamine and Flonase 2 puffs twice daily bilateral nares  -He will return in a few weeks for symptom check and to discuss tonsillectomy if indicated at the time  Update 01/21/23 Had another visit to ED for neck pain, persistent throat discomfort. Repeat CT neck with no abscess, evidence of previously seen right tonsillar cyst. Otherwise unremarkable.   Chronic sore throat - improved after abx, but still has sensation of tonsillar cyst draining mucus  2.  Chronic tonsillitis history of tonsillar hypertrophy -I suspect most of his sore throat and throat pain symptoms are due to chronic tonsillitis and we discussed tonsillectomy -Risks and benefits of tonsillectomy were discussed today and he would like to proceed - will book for tonsillectomy  3.  GERD LPR -Continue Pepcid 20 mg daily -Diet and lifestyle changes to minimize reflux - trial of reflux gourmet   4.  Nasal congestion suspect environmental allergies -Continue daily antihistamine and Flonase 2 puffs twice daily bilateral nares  5. Neck pain along right SCM area (superior aspect) no palpable masses on exam - likely musculoskeletal  - continue Naproxen as needed and consider massage therapy   6. Recurrent R facial swelling/pain - likely TMJ related - no salivary gland swelling or mass on exam today and good dentition  - we discussed preventative care and supportive care when pain swelling recurs  - no pathology in the area on CT neck x 2   Ashok Croon, MD Otolaryngology [facility] Phone: [phone] Fax:  [phone]    01/21/2023, 7:52 PM

## 2023-01-25 ENCOUNTER — Institutional Professional Consult (permissible substitution) (INDEPENDENT_AMBULATORY_CARE_PROVIDER_SITE_OTHER): Payer: Self-pay | Admitting: Otolaryngology

## 2023-01-30 ENCOUNTER — Other Ambulatory Visit: Payer: Self-pay

## 2023-01-30 ENCOUNTER — Encounter (HOSPITAL_COMMUNITY): Payer: Self-pay | Admitting: *Deleted

## 2023-01-30 NOTE — Progress Notes (Signed)
PCP - [facility]  Cardiologist - none   Chest x-ray - 10/20/22 EKG - 10/20/22 Stress Test - n/a ECHO - n/a Cardiac Cath - n/a  ICD Pacemaker/Loop - n/a  Sleep Study -  n/a  Diabetes - n/a  ERAS - Clear liquids til 10 am DOS.  STOP now taking any Aspirin (unless otherwise instructed by your surgeon), Aleve, Naproxen, Ibuprofen, Motrin, Advil, Goody's, BC's, all herbal medications, fish oil, and all vitamins.   Coronavirus Screening Do you have any of the following symptoms:  Cough yes/no: No Fever (>100.76F)  yes/no: No Runny nose yes/no: No Sore throat yes/no: No Difficulty breathing/shortness of breath  yes/no: No  Have you traveled in the last 14 days and where? yes/no: No  Patient verbalized understanding of instructions that were given via phone.

## 2023-02-01 ENCOUNTER — Other Ambulatory Visit: Payer: Self-pay

## 2023-02-01 ENCOUNTER — Other Ambulatory Visit (HOSPITAL_COMMUNITY): Payer: Self-pay

## 2023-02-01 ENCOUNTER — Ambulatory Visit (HOSPITAL_BASED_OUTPATIENT_CLINIC_OR_DEPARTMENT_OTHER): Payer: Commercial Managed Care - HMO | Admitting: Registered Nurse

## 2023-02-01 ENCOUNTER — Ambulatory Visit (HOSPITAL_COMMUNITY): Payer: Commercial Managed Care - HMO | Admitting: Registered Nurse

## 2023-02-01 ENCOUNTER — Encounter (HOSPITAL_COMMUNITY): Payer: Self-pay

## 2023-02-01 ENCOUNTER — Ambulatory Visit (HOSPITAL_COMMUNITY)
Admission: RE | Admit: 2023-02-01 | Discharge: 2023-02-01 | Disposition: A | Discharge status: Home / Self Care | Payer: Commercial Managed Care - HMO | Attending: Otolaryngology | Admitting: Otolaryngology

## 2023-02-01 ENCOUNTER — Encounter (HOSPITAL_COMMUNITY)
Admission: RE | Disposition: A | Discharge status: Home / Self Care | Payer: Self-pay | Source: Home / Self Care | Attending: Otolaryngology

## 2023-02-01 DIAGNOSIS — J3501 Chronic tonsillitis: Secondary | ICD-10-CM

## 2023-02-01 DIAGNOSIS — K219 Gastro-esophageal reflux disease without esophagitis: Secondary | ICD-10-CM | POA: Diagnosis not present

## 2023-02-01 DIAGNOSIS — J45909 Unspecified asthma, uncomplicated: Secondary | ICD-10-CM | POA: Diagnosis not present

## 2023-02-01 DIAGNOSIS — F1290 Cannabis use, unspecified, uncomplicated: Secondary | ICD-10-CM | POA: Diagnosis not present

## 2023-02-01 DIAGNOSIS — J351 Hypertrophy of tonsils: Secondary | ICD-10-CM

## 2023-02-01 DIAGNOSIS — Z87891 Personal history of nicotine dependence: Secondary | ICD-10-CM | POA: Insufficient documentation

## 2023-02-01 DIAGNOSIS — J358 Other chronic diseases of tonsils and adenoids: Secondary | ICD-10-CM | POA: Diagnosis not present

## 2023-02-01 HISTORY — PX: TONSILLECTOMY: SHX5217

## 2023-02-01 HISTORY — DX: Headache, unspecified: R51.9

## 2023-02-01 HISTORY — DX: Other specified postprocedural states: Z98.890

## 2023-02-01 HISTORY — DX: Gastro-esophageal reflux disease without esophagitis: K21.9

## 2023-02-01 HISTORY — DX: Other seasonal allergic rhinitis: J30.2

## 2023-02-01 HISTORY — DX: Other specified postprocedural states: R11.2

## 2023-02-01 HISTORY — DX: Personal history of urinary calculi: Z87.442

## 2023-02-01 SURGERY — TONSILLECTOMY
Anesthesia: General | Site: Mouth | Laterality: Bilateral

## 2023-02-01 MED ORDER — ACETAMINOPHEN 10 MG/ML IV SOLN: 1000.0000 mg | Freq: Once | INTRAVENOUS | Status: DC | PRN

## 2023-02-01 MED ORDER — ACETAMINOPHEN 500 MG PO TABS
500.0000 mg | ORAL_TABLET | Freq: Four times a day (QID) | ORAL | 0 refills | Status: AC
  Filled 2023-02-01: qty 30, 8d supply, fill #0

## 2023-02-01 MED ORDER — FENTANYL CITRATE (PF) 100 MCG/2ML IJ SOLN
INTRAMUSCULAR | Status: AC
  Filled 2023-02-01: qty 2

## 2023-02-01 MED ORDER — CHLORHEXIDINE GLUCONATE 0.12 % MT SOLN: 15.0000 mL | Freq: Once | OROMUCOSAL | Status: AC

## 2023-02-01 MED ORDER — ACETAMINOPHEN 10 MG/ML IV SOLN
INTRAVENOUS | Status: DC | PRN
  Administered 2023-02-01: 1000 mg via INTRAVENOUS

## 2023-02-01 MED ORDER — ACETAMINOPHEN 160 MG/5ML PO SOLN: 325.0000 mg | ORAL | Status: DC | PRN

## 2023-02-01 MED ORDER — OXYCODONE HCL 5 MG PO TABS
5.0000 mg | ORAL_TABLET | Freq: Four times a day (QID) | ORAL | 0 refills | Status: DC | PRN
  Filled 2023-02-01: qty 30, 7d supply, fill #0

## 2023-02-01 MED ORDER — FENTANYL CITRATE (PF) 100 MCG/2ML IJ SOLN
25.0000 ug | INTRAMUSCULAR | Status: DC | PRN
  Administered 2023-02-01 (×3): 50 ug via INTRAVENOUS

## 2023-02-01 MED ORDER — ONDANSETRON HCL 4 MG/2ML IJ SOLN
INTRAMUSCULAR | Status: DC | PRN
  Administered 2023-02-01: 4 mg via INTRAVENOUS

## 2023-02-01 MED ORDER — PROPOFOL 10 MG/ML IV BOLUS
INTRAVENOUS | Status: AC
Start: 2023-02-01 — End: ?
  Filled 2023-02-01: qty 20

## 2023-02-01 MED ORDER — DROPERIDOL 2.5 MG/ML IJ SOLN
0.6250 mg | Freq: Once | INTRAMUSCULAR | Status: DC | PRN
Start: 2023-02-01 — End: 2023-02-01

## 2023-02-01 MED ORDER — ROCURONIUM BROMIDE 10 MG/ML (PF) SYRINGE
PREFILLED_SYRINGE | INTRAVENOUS | Status: DC | PRN
  Administered 2023-02-01: 50 mg via INTRAVENOUS

## 2023-02-01 MED ORDER — FENTANYL CITRATE (PF) 250 MCG/5ML IJ SOLN
INTRAMUSCULAR | Status: DC | PRN
  Administered 2023-02-01: 50 ug via INTRAVENOUS
  Administered 2023-02-01: 100 ug via INTRAVENOUS

## 2023-02-01 MED ORDER — FENTANYL CITRATE (PF) 250 MCG/5ML IJ SOLN
INTRAMUSCULAR | Status: AC
  Filled 2023-02-01: qty 5

## 2023-02-01 MED ORDER — LACTATED RINGERS IV SOLN: INTRAVENOUS | Status: DC

## 2023-02-01 MED ORDER — OXYCODONE HCL 5 MG PO TABS: 5.0000 mg | ORAL_TABLET | Freq: Once | ORAL | Status: AC | PRN

## 2023-02-01 MED ORDER — ORAL CARE MOUTH RINSE: 15.0000 mL | Freq: Once | OROMUCOSAL | Status: AC

## 2023-02-01 MED ORDER — OXYMETAZOLINE HCL 0.05 % NA SOLN
NASAL | Status: DC | PRN
  Administered 2023-02-01: 1

## 2023-02-01 MED ORDER — OXYCODONE HCL 5 MG/5ML PO SOLN: 5.0000 mg | Freq: Once | ORAL | Status: AC | PRN

## 2023-02-01 MED ORDER — SUGAMMADEX SODIUM 200 MG/2ML IV SOLN
INTRAVENOUS | Status: DC | PRN
  Administered 2023-02-01: 200 mg via INTRAVENOUS

## 2023-02-01 MED ORDER — DEXMEDETOMIDINE HCL IN NACL 80 MCG/20ML IV SOLN
INTRAVENOUS | Status: DC | PRN
  Administered 2023-02-01: 8 ug via INTRAVENOUS
  Administered 2023-02-01: 12 ug via INTRAVENOUS
  Administered 2023-02-01: 8 ug via INTRAVENOUS

## 2023-02-01 MED ORDER — DEXAMETHASONE SODIUM PHOSPHATE 10 MG/ML IJ SOLN
INTRAMUSCULAR | Status: DC | PRN
  Administered 2023-02-01: 10 mg via INTRAVENOUS

## 2023-02-01 MED ORDER — PROPOFOL 10 MG/ML IV BOLUS
INTRAVENOUS | Status: DC | PRN
  Administered 2023-02-01: 200 mg via INTRAVENOUS

## 2023-02-01 MED ORDER — MIDAZOLAM HCL 2 MG/2ML IJ SOLN
INTRAMUSCULAR | Status: AC
  Filled 2023-02-01: qty 2

## 2023-02-01 MED ORDER — LIDOCAINE 2% (20 MG/ML) 5 ML SYRINGE
INTRAMUSCULAR | Status: DC | PRN
  Administered 2023-02-01: 40 mg via INTRAVENOUS

## 2023-02-01 MED ORDER — OXYMETAZOLINE HCL 0.05 % NA SOLN
NASAL | Status: AC
Start: 2023-02-01 — End: ?
  Filled 2023-02-01: qty 30

## 2023-02-01 MED ORDER — CHLORHEXIDINE GLUCONATE 0.12 % MT SOLN
OROMUCOSAL | Status: AC
  Administered 2023-02-01: 15 mL via OROMUCOSAL
  Filled 2023-02-01: qty 15

## 2023-02-01 MED ORDER — ACETAMINOPHEN 10 MG/ML IV SOLN
INTRAVENOUS | Status: AC
  Filled 2023-02-01: qty 100

## 2023-02-01 MED ORDER — MIDAZOLAM HCL 2 MG/2ML IJ SOLN
INTRAMUSCULAR | Status: DC | PRN
  Administered 2023-02-01: 2 mg via INTRAVENOUS

## 2023-02-01 MED ORDER — OXYCODONE HCL 5 MG/5ML PO SOLN
ORAL | Status: AC
  Administered 2023-02-01: 5 mg via ORAL
  Filled 2023-02-01: qty 5

## 2023-02-01 MED ORDER — 0.9 % SODIUM CHLORIDE (POUR BTL) OPTIME
TOPICAL | Status: DC | PRN
  Administered 2023-02-01: 1000 mL

## 2023-02-01 MED ORDER — ACETAMINOPHEN 325 MG PO TABS: 325.0000 mg | ORAL_TABLET | ORAL | Status: DC | PRN

## 2023-02-01 MED ORDER — IBUPROFEN 600 MG PO TABS
600.0000 mg | ORAL_TABLET | Freq: Four times a day (QID) | ORAL | 0 refills | Status: DC
  Filled 2023-02-01: qty 30, 8d supply, fill #0

## 2023-02-01 MED ORDER — ONDANSETRON 4 MG PO TBDP
4.0000 mg | ORAL_TABLET | Freq: Three times a day (TID) | ORAL | 0 refills | Status: DC | PRN
  Filled 2023-02-01: qty 5, 2d supply, fill #0

## 2023-02-01 MED ORDER — HEMOSTATIC AGENTS (NO CHARGE) OPTIME
TOPICAL | Status: DC | PRN
  Administered 2023-02-01: 1 via TOPICAL

## 2023-02-01 SURGICAL SUPPLY — 33 items
BAG COUNTER SPONGE SURGICOUNT (BAG) ×1 IMPLANT
CANISTER SUCT 3000ML PPV (MISCELLANEOUS) ×1 IMPLANT
CATH ROBINSON RED A/P 10FR (CATHETERS) IMPLANT
CATH ROBINSON RED A/P 12FR (CATHETERS) ×1 IMPLANT
CLEANER TIP ELECTROSURG 2X2 (MISCELLANEOUS) ×1 IMPLANT
COAGULATOR SUCT 8FR VV (MISCELLANEOUS) IMPLANT
COAGULATOR SUCT SWTCH 10FR 6 (ELECTROSURGICAL) IMPLANT
CONT SPEC 4OZ CLIKSEAL STRL BL (MISCELLANEOUS) ×1 IMPLANT
ELECT COATED BLADE 2.86 ST (ELECTRODE) ×1 IMPLANT
ELECT REM PT RETURN 9FT ADLT (ELECTROSURGICAL) ×1
ELECT REM PT RETURN 9FT PED (ELECTROSURGICAL)
ELECTRODE REM PT RETRN 9FT PED (ELECTROSURGICAL) IMPLANT
ELECTRODE REM PT RTRN 9FT ADLT (ELECTROSURGICAL) IMPLANT
GAUZE 4X4 16PLY ~~LOC~~+RFID DBL (SPONGE) ×1 IMPLANT
GLOVE BIO SURGEON STRL SZ 6 (GLOVE) ×1 IMPLANT
GOWN STRL REUS W/ TWL LRG LVL3 (GOWN DISPOSABLE) ×2 IMPLANT
GOWN STRL REUS W/TWL LRG LVL3 (GOWN DISPOSABLE) ×2
HEMOSTAT ARISTA ABSORB 3G PWDR (HEMOSTASIS) IMPLANT
KIT BASIN OR (CUSTOM PROCEDURE TRAY) ×1 IMPLANT
KIT TURNOVER KIT B (KITS) ×1 IMPLANT
NS IRRIG 1000ML POUR BTL (IV SOLUTION) ×1 IMPLANT
PACK BASIC III (CUSTOM PROCEDURE TRAY) ×1
PACK SRG BSC III STRL LF ECLPS (CUSTOM PROCEDURE TRAY) ×1 IMPLANT
PAD ARMBOARD 7.5X6 YLW CONV (MISCELLANEOUS) IMPLANT
PATTIES SURGICAL .5 X3 (DISPOSABLE) ×1 IMPLANT
PENCIL SMOKE EVACUATOR (MISCELLANEOUS) ×1 IMPLANT
POSITIONER HEAD DONUT 9IN (MISCELLANEOUS) ×1 IMPLANT
SPONGE TONSIL 1.25 RF SGL STRG (GAUZE/BANDAGES/DRESSINGS) IMPLANT
SYR BULB EAR ULCER 3OZ GRN STR (SYRINGE) ×1 IMPLANT
TOWEL GREEN STERILE FF (TOWEL DISPOSABLE) ×1 IMPLANT
TUBE CONNECTING 12X1/4 (SUCTIONS) ×1 IMPLANT
TUBE SALEM SUMP 16F (TUBING) ×1 IMPLANT
YANKAUER SUCT BULB TIP NO VENT (SUCTIONS) ×1 IMPLANT

## 2023-02-01 NOTE — Transfer of Care (Signed)
Immediate Anesthesia Transfer of Care Note  Patient: Dave Hernandez  Procedure(s) Performed: TONSILLECTOMY (Bilateral: Mouth)  Patient Location: PACU  Anesthesia Type:General  Level of Consciousness: drowsy and responds to stimulation  Airway & Oxygen Therapy: Patient Spontanous Breathing  Post-op Assessment: Report given to RN and Post -op Vital signs reviewed and stable  Post vital signs: Reviewed and stable  Last Vitals:  Vitals Value Taken Time  BP 143/80 02/01/23 1415  Temp 36.9 C 02/01/23 1411  Pulse 80 02/01/23 1415  Resp 19 02/01/23 1415  SpO2 97 % 02/01/23 1415  Vitals shown include unfiled device data.  Last Pain:  Vitals:   02/01/23 1133  PainSc: 0-No pain         Complications: No notable events documented.

## 2023-02-01 NOTE — Op Note (Signed)
OPERATIVE REPORT   Preoperative Diagnosis:  Recurrent Tonsillitis, Tonsillar Hypertrophy Right tonsillar cyst with persistent drainage   Postoperative Diagnosis:  Recurrent Tonsillitis, Tonsillar Hypertrophy Right tonsillar cyst with persistent drainage   Procedure: Tonsillectomy   Surgeon: Ashok Croon, MD   Assisstant: None   IVF: per anesthesia    Anesthesia: General   Preoperative Indication:   27 year-old male has been seen in the office for recurrent episodes of sore throat refractory to medical management and evidence of the right tonsillar cyst which had some drainage. Risks and Benefits of surgery were discussed with the patient including bleeding, anesthesia risk, trouble breathing, dehydration and sore throat.   Operative Procedure: The patient was correctly identified and brought to the operating room and placed in supine position. An endotracheal tube was placed without difficulty and the head of the bed was rotated 90 degrees with respect to anestheisa. A Crowe- Davis mouth gag was used to expose the oropharynx. A red rubber catheter was placed in the right naris to retract the soft palate. A curved Allis was used to grasp the right and left tonsils in an consecutive fashion. Of note there was evidence of a right tonsillar cyst with some drainage, in the location where it was previously seen during office exam. A Bovie was used to gently dissect the tonsils away from the surrounding muscular pillars. Hemostasis was obtained along the way. The mouth gag was let down for 3 minutes to evaluate for any bleeding. A small amount of Afrin and Arista was applied along the tonsillar fossa at the end of the case. The oropharynx was copiously irrigated with saline and the stomach contents were suctioned. The patient was returned to anesthesia and awoken in stable condition.   The patient will be discharged on pain medications. A follow up will be scheduled in 3 weeks

## 2023-02-01 NOTE — Anesthesia Procedure Notes (Signed)
Procedure Name: Intubation Date/Time: 02/01/2023 1:12 PM  Performed by: Loleta Gearl Kimbrough, CRNAPre-anesthesia Checklist: Patient identified, Patient being monitored, Timeout performed, Emergency Drugs available and Suction available Patient Re-evaluated:Patient Re-evaluated prior to induction Oxygen Delivery Method: Circle system utilized Preoxygenation: Pre-oxygenation with 100% oxygen Induction Type: IV induction Ventilation: Mask ventilation without difficulty Laryngoscope Size: Mac and 4 Grade View: Grade I Tube type: Oral Tube size: 7.5 mm Number of attempts: 1 Airway Equipment and Method: Stylet Placement Confirmation: ETT inserted through vocal cords under direct vision, positive ETCO2 and breath sounds checked- equal and bilateral Secured at: 23 cm Tube secured with: Tape Dental Injury: Teeth and Oropharynx as per pre-operative assessment

## 2023-02-01 NOTE — Interval H&P Note (Signed)
History and Physical Interval Note:  02/01/2023 10:55 AM  Dave Hernandez  has presented today for surgery, with the diagnosis of CHRONIC TONSILITIS.  The various methods of treatment have been discussed with the patient and family. After consideration of risks, benefits and other options for treatment, the patient has consented to  Procedure(s): TONSILLECTOMY (Bilateral) as a surgical intervention.  The patient's history has been reviewed, patient examined, no change in status, stable for surgery.  I have reviewed the patient's chart and labs.  Questions were answered to the patient's satisfaction.     Ashok Croon

## 2023-02-01 NOTE — Discharge Instructions (Signed)
Post-Operative Instructions (Tonsillectomy)  What to Expect: - It is common to have a sore throat for several days after surgery from the breathing tube. - You can eat and drink anything you normally do - there are no restrictions due to surgery alone.  If you have been recommended any other type of diet, such as an acid reflux diet, you should continue that.  You may want to eat light meals the day of anesthesia to make sure you don't get nauseated. - Take scheduled Tylenol and Motrin every 6 hrs staggered 3 hrs apart from each other. If your pain is still severe while doing that, and it will be the first 2-3 days, take Oxycodone - It is very important that you stay well-hydrated and drink plenty of fluids during the recovery period.  Getting dehydrated tends to worsen post-operative pain. - After tonsillectomy, you should avoid any exercise or activity that gets your heart rate up for at least 10 days after surgery. - After tonsillectomy, you need to watch out for bleeding from the back of your mouth where the tonsils were; this can occur as a brief episode of bleeding that stops on its own, or as more serious or persistent bleeding.  If it is persistent, you should have someone drive you to the [facility] ER immediately and call the numbers above on the way to notify us.   - If the bleeding is brief and stops on its own, you can gargle cold water and call my team at the numbers above for further instructions. - You should avoid lifting anything heavier than a gallon of milk for 2 weeks. - Call the office if you experience any of the following: Fever higher than 101 F Difficulty breathing or swallowing Bleeding that occurs suddenly or briskly and won't stop, or any other concerning bleeding Sharp increase in pain - note that with tonsillectomy, a sharp increase in pain is normal around day 5-7 after surgery. It is also normal for pain to be much worse in the mornings when you wake up. Nausea and  Vomiting  Take Zofran if you experience nausea. It is important to take pain medications with some food in your stomach to avoid nausea and vomiting  You should contact Dr Leighton Roach office at [phone] if you need to be seen or if you experience any of the above symptoms.

## 2023-02-01 NOTE — Anesthesia Preprocedure Evaluation (Addendum)
Anesthesia Evaluation  Patient identified by MRN, date of birth, ID band Patient awake    Reviewed: Allergy & Precautions, NPO status , Patient's Chart, lab work & pertinent test results  History of Anesthesia Complications (+) PONV and history of anesthetic complications  Airway Mallampati: II  TM Distance: >3 FB Neck ROM: Full    Dental  (+) Teeth Intact, Dental Advisory Given   Pulmonary asthma , former smoker   breath sounds clear to auscultation       Cardiovascular negative cardio ROS  Rhythm:Regular Rate:Normal     Neuro/Psych  Headaches PSYCHIATRIC DISORDERS Anxiety Depression       GI/Hepatic Neg liver ROS,GERD  ,,  Endo/Other    Renal/GU negative Renal ROS     Musculoskeletal   Abdominal   Peds  Hematology   Anesthesia Other Findings   Reproductive/Obstetrics                             Anesthesia Physical Anesthesia Plan  ASA: 2  Anesthesia Plan: General   Post-op Pain Management: Tylenol PO (pre-op)* and Toradol IV (intra-op)*   Induction: Intravenous  PONV Risk Score and Plan: 3 and Ondansetron, Dexamethasone and Midazolam  Airway Management Planned: Oral ETT  Additional Equipment: None  Intra-op Plan:   Post-operative Plan: Extubation in OR  Informed Consent: I have reviewed the patients History and Physical, chart, labs and discussed the procedure including the risks, benefits and alternatives for the proposed anesthesia with the patient or authorized representative who has indicated his/her understanding and acceptance.     Dental advisory given  Plan Discussed with: CRNA  Anesthesia Plan Comments:        Anesthesia Quick Evaluation

## 2023-02-02 NOTE — Anesthesia Postprocedure Evaluation (Signed)
Anesthesia Post Note  Patient: Dave Hernandez  Procedure(s) Performed: TONSILLECTOMY (Bilateral: Mouth)     Patient location during evaluation: PACU Anesthesia Type: General Level of consciousness: awake and alert Pain management: pain level controlled Vital Signs Assessment: post-procedure vital signs reviewed and stable Respiratory status: spontaneous breathing, nonlabored ventilation, respiratory function stable and patient connected to nasal cannula oxygen Cardiovascular status: blood pressure returned to baseline and stable Postop Assessment: no apparent nausea or vomiting Anesthetic complications: no   No notable events documented.               Shelton Silvas

## 2023-02-03 ENCOUNTER — Encounter (HOSPITAL_COMMUNITY): Payer: Self-pay | Admitting: Otolaryngology

## 2023-02-04 ENCOUNTER — Ambulatory Visit (INDEPENDENT_AMBULATORY_CARE_PROVIDER_SITE_OTHER): Payer: Commercial Managed Care - HMO | Admitting: Otolaryngology

## 2023-02-04 LAB — SURGICAL PATHOLOGY

## 2023-02-05 DIAGNOSIS — J3501 Chronic tonsillitis: Secondary | ICD-10-CM

## 2023-02-08 ENCOUNTER — Other Ambulatory Visit (INDEPENDENT_AMBULATORY_CARE_PROVIDER_SITE_OTHER): Payer: Self-pay | Admitting: Otolaryngology

## 2023-02-08 MED ORDER — METHYLPREDNISOLONE 4 MG PO TBPK: ORAL_TABLET | ORAL | 1 refills | Status: DC

## 2023-02-08 NOTE — Progress Notes (Signed)
Patient is having ear pain b/l and some ringing, likely related to tonsillectomy and referred pain from the throat to the ears. Will send Medrol Dose pack to the pharmacy.

## 2023-02-11 ENCOUNTER — Encounter (INDEPENDENT_AMBULATORY_CARE_PROVIDER_SITE_OTHER): Payer: Managed Care, Other (non HMO) | Admitting: Otolaryngology

## 2023-02-22 ENCOUNTER — Ambulatory Visit (INDEPENDENT_AMBULATORY_CARE_PROVIDER_SITE_OTHER): Payer: Self-pay | Admitting: Otolaryngology

## 2023-02-22 ENCOUNTER — Encounter (INDEPENDENT_AMBULATORY_CARE_PROVIDER_SITE_OTHER): Payer: Self-pay | Admitting: Otolaryngology

## 2023-02-22 ENCOUNTER — Encounter (INDEPENDENT_AMBULATORY_CARE_PROVIDER_SITE_OTHER): Payer: Managed Care, Other (non HMO) | Admitting: Otolaryngology

## 2023-02-22 VITALS — BP 136/87 | HR 77

## 2023-02-22 DIAGNOSIS — M26609 Unspecified temporomandibular joint disorder, unspecified side: Secondary | ICD-10-CM

## 2023-02-22 DIAGNOSIS — K219 Gastro-esophageal reflux disease without esophagitis: Secondary | ICD-10-CM

## 2023-02-22 DIAGNOSIS — J3501 Chronic tonsillitis: Secondary | ICD-10-CM

## 2023-02-22 DIAGNOSIS — J358 Other chronic diseases of tonsils and adenoids: Secondary | ICD-10-CM

## 2023-02-22 DIAGNOSIS — M26621 Arthralgia of right temporomandibular joint: Secondary | ICD-10-CM

## 2023-02-22 DIAGNOSIS — J351 Hypertrophy of tonsils: Secondary | ICD-10-CM

## 2023-02-22 DIAGNOSIS — Z09 Encounter for follow-up examination after completed treatment for conditions other than malignant neoplasm: Secondary | ICD-10-CM

## 2023-02-22 DIAGNOSIS — J312 Chronic pharyngitis: Secondary | ICD-10-CM

## 2023-02-22 NOTE — Progress Notes (Signed)
ENT Progress Note:  Update 02/22/23 S/p tonsillectomy for chronic tonsillitis and previously noted right tonsillar cyst, here for post-op f/u. Reports his throat pain is better now and he is eating regular but soft diet currently. No bleeding episodes. He had some ear discomfort with pain with talking initially, but it got better. Has mild right temple and frontal headache. Has had some pain along the right face. He has hx of bruxism.   Update 01/21/23: Returns for follow-up.  Finished antibiotics.  He had another visit to the ER 01/12/2023 due to recurrent throat discomfort and sensation of drainage from the lower right tonsillar cyst.  He also had recurrent swelling along the right face and neck pain on the right side.  He admits to doing physical work prior to onset of neck discomfort. He was started on Naproxen. No dental pain or infected teeth.  While in ED he had overall unremarkable CT neck with contrast which did not demonstrate peritonsillar abscess but did show previously seen right tonsillar cyst.   Records reviewed  ED visit note 01/12/2023 Patient has a history of depression and anxiety kidney stones pneumonia and a recent diagnosis of a tonsillar abscess. Patient was seen in the emergency room on October 20. He was complaining of sore throat and had a CT scan of his neck. The CT scan showed a small less than 1 cm fluid collection along the lateral oropharyngeal wall at the inferior aspect of the right palate teen tonsil. Patient followed up with ENT Dr. Irene Pap on October 21. Plan was to continue Augmentin and steroids. No indication for incision and drainage patient was instructed if any worsening symptoms to return to the ED. Patient states this morning he felt like he felt the bump on the side of his neck. He also felt like there may have been some swelling on the right side of his face and he was having some headache and discomfort on the right side of his head. He has not had any fevers  or chills. He is not having any difficulty swallowing.   Initial evaluation by me Reason for Consult: Right tonsillar collection with drainage  HPI: Dave Hernandez is an 27 y.o. male with hx of long-standing sore throat and a cyst, that appears to drain on the right side of his throat. At times he is able to taste the drainage, when it drains on its own. He reports that 1-2 months ago he developed left ear pressure, went to ED and at first was diagnosed with an ear infection. It started to clear up. He then developed an abscess right lower mandibular area, cleared with Clindamycin and it was thought to be dental in etiology. Then his left ear pain returned. Two weeks ago he developed sore throat again, would get better with Aleve. His sore throat became more persistent later on, and when he looked at his throat himself he noticed the "abscess" on the right side of his throat. He was given Z-pack. The next day he went to urgent care 2/2 persistent sx, and was told to take Clindamycin. He was seen in ED yesterday and was started on prednisone and Augmentin.  He denies fever, he denies trouble with swallowing. He feels his voice is hoarse. Denies poor dentiition.     Records Reviewed:  ED note from yesterday Briefly: Patient is 27 y.o. "presents today for evaluation of sore throat.  Patient reports that he can see an abscess in his throat.  He endorses cold/chills.  Denies any fever.  Denies any issue with eating, drinking or breathing.  He was given clindamycin for the abscess however he reports no improvement."   DDX: concern for  strep, peritonsillar abscess, retropharyngeal abscess, epiglottitis infectious etiology    Plan:  - Area of swelling and draining has been present for 6 days. Patient started on Azithromycin on the first day which was changed to Clindamycin Friday afternoon. States that there has been no resolution in swelling. No difficulty swallowing or breathing - is spitting in bag  because patient states that he can taste the purulence.  - obtaining swab of area for culture.  - CT showing small (9 x 7 x 5 mm) peripherally enhancing fluid collection along the superficial/mucosal surface of the right lateral oropharyngeal wall at the inferior aspect of the right palatine tonsil. This could represent a small abscess or cyst (potentially infected). While thought less likely, recommend correlation with direct inspection to exclude malignancy. - Consulted with ENT (Dr. Irene Pap) who recommends Augmentin and steroids and would like to see patient in office tomorrow morning. Shared information with patient who verbalized understanding of plan.  - patient stating that he had an allergic reaction to penicillin as an infant - is not able to provide the reaction. Patient talked with his father who is also not able to remember the allergic reaction that patient had to penicillin. Provided patient with a dose of Augmentin ED which he tolerated well. Sent rest of prescription to pharmacy. - patient afebrile with stable vitals. Provided with return precautions. Discharged in good condition. Answered patient's questions.     Past Medical History:  Diagnosis Date   Anxiety    Asthma    as a child, no inhaler   COVID-19    Depression    GERD (gastroesophageal reflux disease)    Headache    History of kidney stones    passed stones, no surgery   Pneumonia    x 1   PONV (postoperative nausea and vomiting)    Seasonal allergies     Past Surgical History:  Procedure Laterality Date   TONSILLECTOMY Bilateral 02/01/2023   Procedure: TONSILLECTOMY;  Surgeon: Ashok Croon, MD;  Location: [facility] OR;  Service: ENT;  Laterality: Bilateral;   WISDOM TOOTH EXTRACTION      Family History  Problem Relation Age of Onset   Heart attack Mother    Diabetes Father    Heart failure Maternal Grandfather    Breast cancer Paternal Grandmother    Diabetes Paternal Grandfather    Prostate cancer  Paternal Uncle    Diabetes Paternal Uncle     Social History:  reports that he quit smoking about 6 years ago. His smoking use included cigarettes. He has never used smokeless tobacco. He reports that he does not currently use alcohol. He reports current drug use. Drug: Marijuana.  Allergies:  Allergies  Allergen Reactions   Penicillins Other (See Comments)    Childhood allergy - Patient tolerated Augmentin well in ED without signs of anaphylaxis or any other allergic reaction.     Medications: I have reviewed the patient's current medications.  The PMH, PSH, Medications, Allergies, and SH were reviewed and updated.  ROS: Constitutional: Negative for fever, weight loss and weight gain. Cardiovascular: Negative for chest pain and dyspnea on exertion. Respiratory: Is not experiencing shortness of breath at rest. Gastrointestinal: Negative for nausea and vomiting. Neurological: Negative for headaches. Psychiatric: The patient is not nervous/anxious  Blood pressure 136/87, pulse 77, SpO2 97%.  PHYSICAL EXAM:  Exam: General: Well-developed, well-nourished Respiratory Respiratory effort: Equal inspiration and expiration without stridor Cardiovascular Peripheral Vascular: Warm extremities with equal color/perfusion Eyes: No nystagmus with equal extraocular motion bilaterally Neuro/Psych/Balance: Patient oriented to person, place, and time; Appropriate mood and affect; Gait is intact with no imbalance; Cranial nerves I-XII are intact Head and Face Inspection: Normocephalic and atraumatic without mass or lesion Facial Strength: Facial motility symmetric and full bilaterally ENT Pinna: External ear intact and fully developed External canal: Canal is patent with intact skin Tympanic Membrane: Clear and mobile External Nose: No scar or anatomic deformity Internal Nose: Septum is deviated to the left on anterior rhinoscopy Bilateral inferior turbinate hypertrophy.  Lips, Teeth, and  gums: Mucosa and teeth intact and viable TMJ: Tender to palpation R side with full mobility Oral cavity/oropharynx: No exudate, no lesions present. Post-surgical changes, healed completely following tonsillectomy Neck Neck and Trachea: Midline trachea without mass or lesion     Studies Reviewed: CT neck with contrast 01/06/23  FINDINGS: Pharynx and larynx: Small (9 x 7 x 5 mm) peripherally enhancing fluid collection along the superficial/mucosal surface of the right lateral oropharyngeal wall at the inferior aspect of the right palatine tonsil (see series 3, image 62 and series 6, image 32).   Salivary glands: No inflammation, mass, or stone.   Thyroid: Normal.   Lymph nodes: None enlarged or abnormal density.   Vascular: Not well evaluated on this study due to non arterial contrast timing.   Limited intracranial: Negative.   Visualized orbits: Negative.   Mastoids and visualized paranasal sinuses: Clear.   Skeleton: No acute or aggressive process.   Upper chest: Visualized lung apices are clear.   IMPRESSION: Small (9 x 7 x 5 mm) peripherally enhancing fluid collection along the superficial/mucosal surface of the right lateral oropharyngeal wall at the inferior aspect of the right palatine tonsil. This could represent a small abscess or cyst (potentially infected). While thought less likely, recommend correlation with direct inspection to exclude malignancy.  01/12/23 IMPRESSION: 1. Unchanged mild asymmetry of the right lower palatine tonsil, which is more likely a benign retention cyst rather than sequelae of tonsillar infection given absence of parapharyngeal inflammation, lymphadenopathy, or other acute findings. 2. Otherwise stable and negative CT appearance of the Neck  Assessment/Plan: Encounter Diagnoses  Name Primary?   Chronic tonsillitis Yes   Cyst of tonsil    Chronic sore throat [J31.2]    Gastroesophageal reflux disease without esophagitis  [K21.9]    Tonsillar hypertrophy    TMJ (temporomandibular joint disorder)    Arthralgia of right temporomandibular joint      Chronic sore throat pain with swallowing concern for tonsillar abscess.  I reviewed CT neck imaging and it appears to have well circumcised mildly enhancing lesion next to the inferior area of the right tonsil which is consistent with right tonsillar cyst seen on flexible laryngoscopy and oropharyngeal exam today  2.  Chronic tonsillitis history of tonsillar hypertrophy -I suspect most of his sore throat and throat pain symptoms are due to chronic tonsillitis and we discussed tonsillectomy -Risks and benefits of tonsillectomy were discussed today he will consider in the future if symptoms persist -He will finish Augmentin and steroid course given to him in ED yesterday -We discussed how management of postnasal drainage and GERD LPR can improve sore throat symptoms -Gargle with salt water  3.  GERD LPR -Continue Pepcid 20 mg daily -Diet and lifestyle changes to minimize reflux  4.  Nasal congestion  suspect environmental allergies -Continue daily antihistamine and Flonase 2 puffs twice daily bilateral nares  -He will return in a few weeks for symptom check and to discuss tonsillectomy if indicated at the time  Update 01/21/23 Had another visit to ED for neck pain, persistent throat discomfort. Repeat CT neck with no abscess, evidence of previously seen right tonsillar cyst. Otherwise unremarkable.   Chronic sore throat - improved after abx, but still has sensation of tonsillar cyst draining mucus  2.  Chronic tonsillitis history of tonsillar hypertrophy -I suspect most of his sore throat and throat pain symptoms are due to chronic tonsillitis and we discussed tonsillectomy -Risks and benefits of tonsillectomy were discussed today and he would like to proceed - will book for tonsillectomy  3.  GERD LPR -Continue Pepcid 20 mg daily -Diet and lifestyle changes  to minimize reflux - trial of reflux gourmet   4.  Nasal congestion suspect environmental allergies -Continue daily antihistamine and Flonase 2 puffs twice daily bilateral nares  5. Neck pain along right SCM area (superior aspect) no palpable masses on exam - likely musculoskeletal  - continue Naproxen as needed and consider massage therapy   6. 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 He was tender on exam, without trismus. Oropharynx appears completely healed at this point. No bleeding, he is eating regular diet.   Chronic tonsillitis and sore throat/tonsillar cyst  - s/p tonsillectomy no issues post-op   GERD LPR -Continue Pepcid 20 mg daily -Diet and lifestyle changes to minimize reflux - trial of reflux gourmet   Nasal congestion suspect environmental allergies -Continue daily antihistamine and Flonase 2 puffs twice daily bilateral nares  Neck pain along right SCM area (superior aspect) no palpable masses on exam - likely musculoskeletal - currently better - continue Naproxen as needed and consider massage therapy   Recurrent R facial swelling/pain - likely TMJ related - no salivary gland swelling or mass on exam today and good dentition but tender at the R TMJ - we discussed preventative care and supportive care  - had negative CT x 2 which we reviewed before   Ashok Croon, MD Otolaryngology [facility] Phone: [phone] Fax: [phone]    02/22/2023, 4:15 PM

## 2023-02-22 NOTE — Patient Instructions (Signed)
  TMJ (Temporomandibular Joint Syndrome) The temporomandibular (tem-puh-roe-man-DIB-u-lur) joint (TMJ) acts like a sliding hinge, connecting your jawbone to your skull. You have one joint on each side of your jaw. TMJ disorders -- a type of temporomandibular disorder or TMD -- can cause pain in your jaw joint and in the muscles that control jaw movement.  The exact cause of a person's TMJ disorder is often difficult to determine. Your pain may be due to a combination of factors, such as genetics, arthritis or jaw injury. Some people who have jaw pain also tend to clench or grind their teeth (bruxism), although many people habitually clench or grind their teeth and never develop TMJ disorders.  In most cases, the pain and discomfort associated with TMJ disorders is temporary and can be relieved with self-managed care or nonsurgical treatments. This includes stress reduction, softer diet when the pain is present, anti-inflammatory pain medications such as Motrin and warm compresses.

## 2023-03-08 ENCOUNTER — Encounter (INDEPENDENT_AMBULATORY_CARE_PROVIDER_SITE_OTHER): Payer: Self-pay | Admitting: Otolaryngology

## 2023-03-08 ENCOUNTER — Ambulatory Visit (INDEPENDENT_AMBULATORY_CARE_PROVIDER_SITE_OTHER): Payer: Commercial Managed Care - HMO | Admitting: Physician Assistant

## 2023-03-08 VITALS — BP 134/85 | HR 65

## 2023-03-08 DIAGNOSIS — R519 Headache, unspecified: Secondary | ICD-10-CM

## 2023-03-08 DIAGNOSIS — Z9089 Acquired absence of other organs: Secondary | ICD-10-CM

## 2023-03-08 DIAGNOSIS — G8929 Other chronic pain: Secondary | ICD-10-CM

## 2023-03-08 NOTE — Progress Notes (Signed)
Dear Dr. Tiburcio Pea, Here is my assessment for our mutual patient, Dave Hernandez. Thank you for allowing me the opportunity to care for your patient. Please do not hesitate to contact me should you have any other questions. Sincerely, Burna Forts PA-C  Otolaryngology Clinic Note Referring provider: Dr. Tiburcio Pea HPI:  Dave Hernandez is a 27 y.o. male kindly referred by Dr. Tiburcio Pea for evaluation of headache.   The patient is SP tonsillectomy on 02/01/2023 by Dr. Irene Pap. He was last seen in the office on 02/22/2023. He presents today for evaluation of unrelated concern of headache. Below is a recap of his last office visits.  Update 12/202/2024  The patient notes that he has had ongoing headache over the last several weeks. He reports a past medical history of various types of headaches. He has seen his PCP prior with no concerning findings. His most recent headache started approximately two weeks after his tonsillectomy. He describes it as pressure on the vertex of the head and into the temples. No exacerbating factors, improved with rest. He denies any neuro symptoms, trauma, no acute onset, no infectious symptoms, or neck pain/stiffness. He also notes he feels like he needs to see an eye doctor as he needs to squint to see, sometimes has blurred vision; he has tried to make an appointment for this. Also he states that he was drinking a decent amount of caffeine prior to surgery and now is not having any.  As far as his throat goes he denies any symptoms related to his surgery. No throat pain, difficulty swallowing, fever, swelling, fever, sinus issues.    Update 02/22/23 S/p tonsillectomy for chronic tonsillitis and previously noted right tonsillar cyst, here for post-op f/u. Reports his throat pain is better now and he is eating regular but soft diet currently. No bleeding episodes. He had some ear discomfort with pain with talking initially, but it got better. Has mild right temple and frontal  headache. Has had some pain along the right face. He has hx of bruxism.    Update 01/21/23: Returns for follow-up.  Finished antibiotics.  He had another visit to the ER 01/12/2023 due to recurrent throat discomfort and sensation of drainage from the lower right tonsillar cyst.  He also had recurrent swelling along the right face and neck pain on the right side.  He admits to doing physical work prior to onset of neck discomfort. He was started on Naproxen. No dental pain or infected teeth.  While in ED he had overall unremarkable CT neck with contrast which did not demonstrate peritonsillar abscess but did show previously seen right tonsillar cyst.    Records reviewed  ED visit note 01/12/2023 Patient has a history of depression and anxiety kidney stones pneumonia and a recent diagnosis of a tonsillar abscess. Patient was seen in the emergency room on October 20. He was complaining of sore throat and had a CT scan of his neck. The CT scan showed a small less than 1 cm fluid collection along the lateral oropharyngeal wall at the inferior aspect of the right palate teen tonsil. Patient followed up with ENT Dr. Irene Pap on October 21. Plan was to continue Augmentin and steroids. No indication for incision and drainage patient was instructed if any worsening symptoms to return to the ED. Patient states this morning he felt like he felt the bump on the side of his neck. He also felt like there may have been some swelling on the right side of his face and he  was having some headache and discomfort on the right side of his head. He has not had any fevers or chills. He is not having any difficulty swallowing.    Initial evaluation by me Reason for Consult: Right tonsillar collection with drainage   HPI: Dave Hernandez is an 27 y.o. male with hx of long-standing sore throat and a cyst, that appears to drain on the right side of his throat. At times he is able to taste the drainage, when it drains on its own.  He reports that 1-2 months ago he developed left ear pressure, went to ED and at first was diagnosed with an ear infection. It started to clear up. He then developed an abscess right lower mandibular area, cleared with Clindamycin and it was thought to be dental in etiology. Then his left ear pain returned. Two weeks ago he developed sore throat again, would get better with Aleve. His sore throat became more persistent later on, and when he looked at his throat himself he noticed the "abscess" on the right side of his throat. He was given Z-pack. The next day he went to urgent care 2/2 persistent sx, and was told to take Clindamycin. He was seen in ED yesterday and was started on prednisone and Augmentin.  He denies fever, he denies trouble with swallowing. He feels his voice is hoarse. Denies poor dentiition.      Independent Review of Additional Tests or Records:   CT head wo contrast on 12/05/2022 for headache- no acute findings noted  PMH/Meds/All/SocHx/FamHx/ROS:   Past Medical History:  Diagnosis Date   Anxiety    Asthma    as a child, no inhaler   COVID-19    Depression    GERD (gastroesophageal reflux disease)    Headache    History of kidney stones    passed stones, no surgery   Pneumonia    x 1   PONV (postoperative nausea and vomiting)    Seasonal allergies      Past Surgical History:  Procedure Laterality Date   TONSILLECTOMY Bilateral 02/01/2023   Procedure: TONSILLECTOMY;  Surgeon: Ashok Croon, MD;  Location: [facility] OR;  Service: ENT;  Laterality: Bilateral;   WISDOM TOOTH EXTRACTION      Family History  Problem Relation Age of Onset   Heart attack Mother    Diabetes Father    Heart failure Maternal Grandfather    Breast cancer Paternal Grandmother    Diabetes Paternal Grandfather    Prostate cancer Paternal Uncle    Diabetes Paternal Uncle      Social Connections: Not on file      Current Outpatient Medications:    acetaminophen (TYLENOL) 500 MG  tablet, Take 1 tablet (500 mg total) by mouth every 6 (six) hours for 2-3 days then take every 6 hours as needed, Disp: 30 tablet, Rfl: 0   fexofenadine (ALLEGRA) 180 MG tablet, Take 180 mg by mouth daily as needed for allergies., Disp: , Rfl:    fluticasone (FLONASE) 50 MCG/ACT nasal spray, Place into both nostrils daily., Disp: , Rfl:    ibuprofen (ADVIL) 600 MG tablet, Take 1 tablet (600 mg total) by mouth every 6 (six) hours. Stagger administration times with Tylenol 3 hrs apart, Disp: 30 tablet, Rfl: 0   famotidine (PEPCID) 20 MG tablet, Take 1 tablet (20 mg total) by mouth 2 (two) times daily. (Patient not taking: Reported on 03/08/2023), Disp: 30 tablet, Rfl: 1   methylPREDNISolone (MEDROL DOSEPAK) 4 MG TBPK tablet,  Take with signs of chronic sinusitis and take as directed (Patient not taking: Reported on 03/08/2023), Disp: 1 each, Rfl: 1   ondansetron (ZOFRAN-ODT) 4 MG disintegrating tablet, Take 1 tablet (4 mg total) by mouth every 8 (eight) hours as needed for nausea or vomiting. (Patient not taking: Reported on 03/08/2023), Disp: 5 tablet, Rfl: 0   oxyCODONE (ROXICODONE) 5 MG immediate release tablet, Take 1 tablet (5 mg total) by mouth every 6 (six) hours as needed for severe pain (pain score 7-10). (Patient not taking: Reported on 03/08/2023), Disp: 30 tablet, Rfl: 0   Physical Exam:   BP 134/85 (BP Location: Right Arm, Patient Position: Sitting, Cuff Size: Normal)   Pulse 65   SpO2 94%   Pertinent Findings  CN II-XII intact Bilateral EAC clear and TM intact with well pneumatized middle ear spaces Anterior rhinoscopy: Septum midline; bilateral inferior turbinates with no hypertrophy or edema No lesions of oral cavity/oropharynx; dentition wnl, post surgical changes of pharynx, healed with no swelling, bleeding, discharge  No obviously palpable neck masses/lymphadenopathy/thyromegaly No respiratory distress or stridor No tenderness or pain at bilateral TMJ  Seprately Identifiable  Procedures:  None  Impression & Plans:  Dave Hernandez is a 27 y.o. male with headache  Headache-  The patient presents today with headache today. This had been going on for several weeks. No red flag, history of the same. Recent CT head September normal. I have low suspicion for any concerning etiology of the the headache. He has recently quit caffeine and also has longstanding issues with vision. He has several reasons for headache. I have recommended he follow up with his PCP (he has appointment next week) for evaluation and management of this as this is not ENT related.   Post op tonsillectomy -  Doing well today with no lingering symptoms. Completely healed. Follow up as needed for this although low likelihood to have any issues related to this at this point.    - f/u With PCP and [place] PRN as needed for throat related complaints    Thank you for allowing me the opportunity to care for your patient. Please do not hesitate to contact me should you have any other questions.  Sincerely, Burna Forts PA-C [facility] Phone: [phone] Fax: [phone]  03/08/2023, 8:26 AM

## 2023-03-08 NOTE — Progress Notes (Signed)
ENT Progress Note:  Update 02/22/23 S/p tonsillectomy for chronic tonsillitis and previously noted right tonsillar cyst, here for post-op f/u. Reports his throat pain is better now and he is eating regular but soft diet currently. No bleeding episodes. He had some ear discomfort with pain with talking initially, but it got better. Has mild right temple and frontal headache. Has had some pain along the right face. He has hx of bruxism.   Update 01/21/23: Returns for follow-up.  Finished antibiotics.  He had another visit to the ER 01/12/2023 due to recurrent throat discomfort and sensation of drainage from the lower right tonsillar cyst.  He also had recurrent swelling along the right face and neck pain on the right side.  He admits to doing physical work prior to onset of neck discomfort. He was started on Naproxen. No dental pain or infected teeth.  While in ED he had overall unremarkable CT neck with contrast which did not demonstrate peritonsillar abscess but did show previously seen right tonsillar cyst.   Records reviewed  ED visit note 01/12/2023 Patient has a history of depression and anxiety kidney stones pneumonia and a recent diagnosis of a tonsillar abscess. Patient was seen in the emergency room on October 20. He was complaining of sore throat and had a CT scan of his neck. The CT scan showed a small less than 1 cm fluid collection along the lateral oropharyngeal wall at the inferior aspect of the right palate teen tonsil. Patient followed up with ENT Dr. Irene Pap on October 21. Plan was to continue Augmentin and steroids. No indication for incision and drainage patient was instructed if any worsening symptoms to return to the ED. Patient states this morning he felt like he felt the bump on the side of his neck. He also felt like there may have been some swelling on the right side of his face and he was having some headache and discomfort on the right side of his head. He has not had any fevers  or chills. He is not having any difficulty swallowing.   Initial evaluation by me Reason for Consult: Right tonsillar collection with drainage  HPI: Dave Hernandez is an 27 y.o. male with hx of long-standing sore throat and a cyst, that appears to drain on the right side of his throat. At times he is able to taste the drainage, when it drains on its own. He reports that 1-2 months ago he developed left ear pressure, went to ED and at first was diagnosed with an ear infection. It started to clear up. He then developed an abscess right lower mandibular area, cleared with Clindamycin and it was thought to be dental in etiology. Then his left ear pain returned. Two weeks ago he developed sore throat again, would get better with Aleve. His sore throat became more persistent later on, and when he looked at his throat himself he noticed the "abscess" on the right side of his throat. He was given Z-pack. The next day he went to urgent care 2/2 persistent sx, and was told to take Clindamycin. He was seen in ED yesterday and was started on prednisone and Augmentin.  He denies fever, he denies trouble with swallowing. He feels his voice is hoarse. Denies poor dentiition.     Records Reviewed:  ED note from yesterday Briefly: Patient is 27 y.o. "presents today for evaluation of sore throat.  Patient reports that he can see an abscess in his throat.  He endorses cold/chills.  Denies any fever.  Denies any issue with eating, drinking or breathing.  He was given clindamycin for the abscess however he reports no improvement."   DDX: concern for  strep, peritonsillar abscess, retropharyngeal abscess, epiglottitis infectious etiology    Plan:  - Area of swelling and draining has been present for 6 days. Patient started on Azithromycin on the first day which was changed to Clindamycin Friday afternoon. States that there has been no resolution in swelling. No difficulty swallowing or breathing - is spitting in bag  because patient states that he can taste the purulence.  - obtaining swab of area for culture.  - CT showing small (9 x 7 x 5 mm) peripherally enhancing fluid collection along the superficial/mucosal surface of the right lateral oropharyngeal wall at the inferior aspect of the right palatine tonsil. This could represent a small abscess or cyst (potentially infected). While thought less likely, recommend correlation with direct inspection to exclude malignancy. - Consulted with ENT (Dr. Irene Pap) who recommends Augmentin and steroids and would like to see patient in office tomorrow morning. Shared information with patient who verbalized understanding of plan.  - patient stating that he had an allergic reaction to penicillin as an infant - is not able to provide the reaction. Patient talked with his father who is also not able to remember the allergic reaction that patient had to penicillin. Provided patient with a dose of Augmentin ED which he tolerated well. Sent rest of prescription to pharmacy. - patient afebrile with stable vitals. Provided with return precautions. Discharged in good condition. Answered patient's questions.     Past Medical History:  Diagnosis Date   Anxiety    Asthma    as a child, no inhaler   COVID-19    Depression    GERD (gastroesophageal reflux disease)    Headache    History of kidney stones    passed stones, no surgery   Pneumonia    x 1   PONV (postoperative nausea and vomiting)    Seasonal allergies     Past Surgical History:  Procedure Laterality Date   TONSILLECTOMY Bilateral 02/01/2023   Procedure: TONSILLECTOMY;  Surgeon: Ashok Croon, MD;  Location: [facility] OR;  Service: ENT;  Laterality: Bilateral;   WISDOM TOOTH EXTRACTION      Family History  Problem Relation Age of Onset   Heart attack Mother    Diabetes Father    Heart failure Maternal Grandfather    Breast cancer Paternal Grandmother    Diabetes Paternal Grandfather    Prostate cancer  Paternal Uncle    Diabetes Paternal Uncle     Social History:  reports that he quit smoking about 6 years ago. His smoking use included cigarettes. He has never used smokeless tobacco. He reports that he does not currently use alcohol. He reports current drug use. Drug: Marijuana.  Allergies:  Allergies  Allergen Reactions   Penicillins Other (See Comments)    Childhood allergy - Patient tolerated Augmentin well in ED without signs of anaphylaxis or any other allergic reaction.     Medications: I have reviewed the patient's current medications.  The PMH, PSH, Medications, Allergies, and SH were reviewed and updated.  ROS: Constitutional: Negative for fever, weight loss and weight gain. Cardiovascular: Negative for chest pain and dyspnea on exertion. Respiratory: Is not experiencing shortness of breath at rest. Gastrointestinal: Negative for nausea and vomiting. Neurological: Negative for headaches. Psychiatric: The patient is not nervous/anxious  There were no vitals taken for this  visit.  PHYSICAL EXAM:  Exam: General: Well-developed, well-nourished Respiratory Respiratory effort: Equal inspiration and expiration without stridor Cardiovascular Peripheral Vascular: Warm extremities with equal color/perfusion Eyes: No nystagmus with equal extraocular motion bilaterally Neuro/Psych/Balance: Patient oriented to person, place, and time; Appropriate mood and affect; Gait is intact with no imbalance; Cranial nerves I-XII are intact Head and Face Inspection: Normocephalic and atraumatic without mass or lesion Facial Strength: Facial motility symmetric and full bilaterally ENT Pinna: External ear intact and fully developed External canal: Canal is patent with intact skin Tympanic Membrane: Clear and mobile External Nose: No scar or anatomic deformity Internal Nose: Septum is deviated to the left on anterior rhinoscopy Bilateral inferior turbinate hypertrophy.  Lips, Teeth, and  gums: Mucosa and teeth intact and viable TMJ: Tender to palpation R side with full mobility Oral cavity/oropharynx: No exudate, no lesions present. Post-surgical changes, healed completely following tonsillectomy Neck Neck and Trachea: Midline trachea without mass or lesion     Studies Reviewed: CT neck with contrast 01/06/23  FINDINGS: Pharynx and larynx: Small (9 x 7 x 5 mm) peripherally enhancing fluid collection along the superficial/mucosal surface of the right lateral oropharyngeal wall at the inferior aspect of the right palatine tonsil (see series 3, image 62 and series 6, image 32).   Salivary glands: No inflammation, mass, or stone.   Thyroid: Normal.   Lymph nodes: None enlarged or abnormal density.   Vascular: Not well evaluated on this study due to non arterial contrast timing.   Limited intracranial: Negative.   Visualized orbits: Negative.   Mastoids and visualized paranasal sinuses: Clear.   Skeleton: No acute or aggressive process.   Upper chest: Visualized lung apices are clear.   IMPRESSION: Small (9 x 7 x 5 mm) peripherally enhancing fluid collection along the superficial/mucosal surface of the right lateral oropharyngeal wall at the inferior aspect of the right palatine tonsil. This could represent a small abscess or cyst (potentially infected). While thought less likely, recommend correlation with direct inspection to exclude malignancy.  01/12/23 IMPRESSION: 1. Unchanged mild asymmetry of the right lower palatine tonsil, which is more likely a benign retention cyst rather than sequelae of tonsillar infection given absence of parapharyngeal inflammation, lymphadenopathy, or other acute findings. 2. Otherwise stable and negative CT appearance of the Neck  Assessment/Plan: No diagnosis found.    Chronic sore throat pain with swallowing concern for tonsillar abscess.  I reviewed CT neck imaging and it appears to have well circumcised mildly  enhancing lesion next to the inferior area of the right tonsil which is consistent with right tonsillar cyst seen on flexible laryngoscopy and oropharyngeal exam today  2.  Chronic tonsillitis history of tonsillar hypertrophy -I suspect most of his sore throat and throat pain symptoms are due to chronic tonsillitis and we discussed tonsillectomy -Risks and benefits of tonsillectomy were discussed today he will consider in the future if symptoms persist -He will finish Augmentin and steroid course given to him in ED yesterday -We discussed how management of postnasal drainage and GERD LPR can improve sore throat symptoms -Gargle with salt water  3.  GERD LPR -Continue Pepcid 20 mg daily -Diet and lifestyle changes to minimize reflux  4.  Nasal congestion suspect environmental allergies -Continue daily antihistamine and Flonase 2 puffs twice daily bilateral nares  -He will return in a few weeks for symptom check and to discuss tonsillectomy if indicated at the time  Update 01/21/23 Had another visit to ED for neck pain, persistent throat discomfort.  Repeat CT neck with no abscess, evidence of previously seen right tonsillar cyst. Otherwise unremarkable.   Chronic sore throat - improved after abx, but still has sensation of tonsillar cyst draining mucus  2.  Chronic tonsillitis history of tonsillar hypertrophy -I suspect most of his sore throat and throat pain symptoms are due to chronic tonsillitis and we discussed tonsillectomy -Risks and benefits of tonsillectomy were discussed today and he would like to proceed - will book for tonsillectomy  3.  GERD LPR -Continue Pepcid 20 mg daily -Diet and lifestyle changes to minimize reflux - trial of reflux gourmet   4.  Nasal congestion suspect environmental allergies -Continue daily antihistamine and Flonase 2 puffs twice daily bilateral nares  5. Neck pain along right SCM area (superior aspect) no palpable masses on exam - likely  musculoskeletal  - continue Naproxen as needed and consider massage therapy   6. 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 He was tender on exam, without trismus. Oropharynx appears completely healed at this point. No bleeding, he is eating regular diet.   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 Phone: [phone] Fax: [phone]    03/08/2023, 7:34 AM

## 2023-03-14 ENCOUNTER — Ambulatory Visit (HOSPITAL_COMMUNITY)
Admission: RE | Admit: 2023-03-14 | Discharge: 2023-03-14 | Disposition: A | Discharge status: Home / Self Care | Payer: Commercial Managed Care - HMO | Source: Ambulatory Visit | Attending: Nurse Practitioner | Admitting: Nurse Practitioner

## 2023-03-14 ENCOUNTER — Other Ambulatory Visit (HOSPITAL_COMMUNITY): Payer: Self-pay | Admitting: Nurse Practitioner

## 2023-03-14 DIAGNOSIS — K098 Other cysts of oral region, not elsewhere classified: Secondary | ICD-10-CM | POA: Diagnosis present

## 2023-04-03 ENCOUNTER — Ambulatory Visit: Payer: Self-pay | Admitting: Internal Medicine

## 2023-04-08 NOTE — Progress Notes (Deleted)
NEW PATIENT Date of Service/Encounter:  04/08/23 Referring provider: {Blank single:19197::"Lucas, Larae Grooms, NP","none-self referred"} Primary care provider: Bing Neighbors, NP  Subjective:  Dave Hernandez is a 28 y.o. male with a PMHx of *** presenting today for evaluation of *** History obtained from: chart review and {Persons; PED relatives w/patient:19415::"patient"}.   Discussed the use of AI scribe software for clinical note transcription with the patient, who gave verbal consent to proceed.  History of Present Illness            Chart Review:  Reviewed PCP notes from referral 03/21/22: head fullness, plan: CT head and sinus ordered, continue flonase, refer to AI  ENT visit - [facility] 02/22/23 s/p tonsillectomy x 3 weeks with tonsillar cyst, TMJ suspected CT sinus 03/14/23: IMPRESSION: 1.  No acute intracranial abnormality. 2.  No acute displaced facial fracture. 3. No acute displaced fracture or traumatic listhesis of the cervical spine. 4. Question slight asymmetry and possible induration of the base of the tongue. Recommend correlation with direct visualization. 5. Previously visualized tonsillar cyst not well visualized with limited evaluation on this noncontrast study.  Other allergy screening: Asthma: {Blank single:19197::"yes","no"} Rhino conjunctivitis: {Blank single:19197::"yes","no"} Food allergy: {Blank single:19197::"yes","no"} Medication allergy: {Blank single:19197::"yes","no"} Hymenoptera allergy: {Blank single:19197::"yes","no"} Urticaria: {Blank single:19197::"yes","no"} Eczema:{Blank single:19197::"yes","no"} History of recurrent infections suggestive of immunodeficency: {Blank single:19197::"yes","no"} ***Vaccinations are up to date.   Past Medical History: Past Medical History:  Diagnosis Date   Anxiety    Asthma    as a child, no inhaler   COVID-19    Depression    GERD (gastroesophageal reflux disease)    Headache    History of  kidney stones    passed stones, no surgery   Pneumonia    x 1   PONV (postoperative nausea and vomiting)    Seasonal allergies    Medication List:  Current Outpatient Medications  Medication Sig Dispense Refill   acetaminophen (TYLENOL) 500 MG tablet Take 1 tablet (500 mg total) by mouth every 6 (six) hours for 2-3 days then take every 6 hours as needed 30 tablet 0   famotidine (PEPCID) 20 MG tablet Take 1 tablet (20 mg total) by mouth 2 (two) times daily. (Patient not taking: Reported on 03/08/2023) 30 tablet 1   fexofenadine (ALLEGRA) 180 MG tablet Take 180 mg by mouth daily as needed for allergies.     fluticasone (FLONASE) 50 MCG/ACT nasal spray Place into both nostrils daily.     ibuprofen (ADVIL) 600 MG tablet Take 1 tablet (600 mg total) by mouth every 6 (six) hours. Stagger administration times with Tylenol 3 hrs apart 30 tablet 0   methylPREDNISolone (MEDROL DOSEPAK) 4 MG TBPK tablet Take with signs of chronic sinusitis and take as directed (Patient not taking: Reported on 03/08/2023) 1 each 1   ondansetron (ZOFRAN-ODT) 4 MG disintegrating tablet Take 1 tablet (4 mg total) by mouth every 8 (eight) hours as needed for nausea or vomiting. (Patient not taking: Reported on 03/08/2023) 5 tablet 0   oxyCODONE (ROXICODONE) 5 MG immediate release tablet Take 1 tablet (5 mg total) by mouth every 6 (six) hours as needed for severe pain (pain score 7-10). (Patient not taking: Reported on 03/08/2023) 30 tablet 0   No current facility-administered medications for this visit.   Known Allergies:  Allergies  Allergen Reactions   Penicillins Other (See Comments)    Childhood allergy - Patient tolerated Augmentin well in ED without signs of anaphylaxis or any other allergic reaction.  Past Surgical History: Past Surgical History:  Procedure Laterality Date   TONSILLECTOMY Bilateral 02/01/2023   Procedure: TONSILLECTOMY;  Surgeon: Ashok Croon, MD;  Location: [facility] OR;  Service: ENT;   Laterality: Bilateral;   WISDOM TOOTH EXTRACTION     Family History: Family History  Problem Relation Age of Onset   Heart attack Mother    Diabetes Father    Heart failure Maternal Grandfather    Breast cancer Paternal Grandmother    Diabetes Paternal Grandfather    Prostate cancer Paternal Uncle    Diabetes Paternal Uncle    Social History: Dave Hernandez lives ***.   ROS:  All other systems negative except as noted per HPI.  Objective:  There were no vitals taken for this visit. There is no height or weight on file to calculate BMI. Physical Exam:  General Appearance:  Alert, cooperative, no distress, appears stated age  Head:  Normocephalic, without obvious abnormality, atraumatic  Eyes:  Conjunctiva clear, EOM's intact  Ears {Blank multiple:19196:a:"***","EACs normal bilaterally","normal TMs bilaterally","ear tubes present bilaterally without exudate"}  Nose: Nares normal, {Blank multiple:19196:a:"***","hypertrophic turbinates","normal mucosa","no visible anterior polyps","septum midline"}  Throat: Lips, tongue normal; teeth and gums normal, {Blank multiple:19196:a:"***","normal posterior oropharynx","tonsils 2+","tonsils 3+","no tonsillar exudate","+ cobblestoning","surgically absent tonsils","mildly erythematous posterior oropharynx"}  Neck: Supple, symmetrical  Lungs:   {Blank multiple:19196:a:"***","clear to auscultation bilaterally","end-expiratory wheezing","wheezing throughout"}, Respirations unlabored, {Blank multiple:19196:a:"***","no coughing","intermittent dry coughing","intermittent productive-sounding cough"}  Heart:  {Blank multiple:19196:a:"***","regular rate and rhythm","no murmur"}, Appears well perfused  Extremities: No edema  Skin: {Blank multiple:19196:a:"***","erythematous, dry patches scattered on ***","lichenification on ***","Skin color, texture, turgor normal","no rashes or lesions on visualized portions of skin"}  Neurologic: No gross deficits    Diagnostics: Spirometry:  Tracings reviewed. His effort: {Blank single:19197::"Good reproducible efforts.","It was hard to get consistent efforts and there is a question as to whether this reflects a maximal maneuver.","Poor effort, data can not be interpreted.","Variable effort-results affected","effort okay for first attempt at spirometry.","Results not reproducible due to ***"} FVC: ***L (pre), ***L  (post) FEV1: ***L, ***% predicted (pre), ***L, ***% predicted (post) FEV1/FVC ratio: *** (pre), *** (post) Interpretation: {Blank single:19197::"Spirometry consistent with mild obstructive disease","Spirometry consistent with moderate obstructive disease","Spirometry consistent with severe obstructive disease","Spirometry consistent with possible restrictive disease","Spirometry consistent with mixed obstructive and restrictive disease","Spirometry uninterpretable due to technique","Spirometry consistent with normal pattern","No overt abnormalities noted given today's efforts","Nonobstructive ratio, low FEV1","Nonobstructive ratio, low FEV1, possible restriction"}.  Please see scanned spirometry results for details.  Skin Testing: {Blank single:19197::"Select foods","Environmental allergy panel","Environmental allergy panel and select foods","Food allergy panel","None","Deferred due to recent antihistamines use","deferred due to recent reaction","Pediatric Environmental Allergy Panel","Pediatric Food Panel","Select foods and environmental allergies"}. {Blank single:19197::"Adequate positive and negative controls","Inadequate positive control-testing invalid","Adequate positive and negative controls, dermatographism present, testing difficult to interpret"}. Results discussed with patient/family.   {Blank single:19197::"Allergy testing results were read and interpreted by myself, documented by clinical staff.","Allergy testing results were read by ***,FNP, documented by clinical staff"}  Labs:   Lab Orders  No laboratory test(s) ordered today     Assessment and Plan  Assessment and Plan               {Blank single:19197::"This note in its entirety was forwarded to the Provider who requested this consultation."}  Other: {Blank multiple:19196:a:"***","samples provided of: ***","school forms provided","reviewed spirometry technique","reviewed inhaler technique"}  Thank you for your kind referral. I appreciate the opportunity to take part in Fateh's care. Please do not hesitate to contact me with questions.***  Sincerely,  Tonny Bollman, MD [facility]

## 2023-04-10 ENCOUNTER — Ambulatory Visit: Payer: Self-pay | Admitting: Internal Medicine

## 2023-04-18 ENCOUNTER — Ambulatory Visit (INDEPENDENT_AMBULATORY_CARE_PROVIDER_SITE_OTHER): Payer: Commercial Managed Care - HMO | Admitting: Otolaryngology

## 2023-04-19 ENCOUNTER — Other Ambulatory Visit: Payer: Self-pay

## 2023-04-19 ENCOUNTER — Encounter: Payer: Self-pay | Admitting: Allergy

## 2023-04-19 ENCOUNTER — Ambulatory Visit (INDEPENDENT_AMBULATORY_CARE_PROVIDER_SITE_OTHER): Payer: Commercial Managed Care - HMO | Admitting: Otolaryngology

## 2023-04-19 ENCOUNTER — Encounter (INDEPENDENT_AMBULATORY_CARE_PROVIDER_SITE_OTHER): Payer: Self-pay | Admitting: Otolaryngology

## 2023-04-19 ENCOUNTER — Ambulatory Visit (INDEPENDENT_AMBULATORY_CARE_PROVIDER_SITE_OTHER): Payer: Commercial Managed Care - HMO | Admitting: Allergy

## 2023-04-19 ENCOUNTER — Other Ambulatory Visit: Payer: Self-pay | Admitting: Allergy

## 2023-04-19 VITALS — BP 154/84 | HR 88

## 2023-04-19 VITALS — BP 132/88 | HR 90 | Temp 99.5°F | Resp 19 | Ht 72.0 in | Wt 213.2 lb

## 2023-04-19 DIAGNOSIS — J310 Chronic rhinitis: Secondary | ICD-10-CM

## 2023-04-19 DIAGNOSIS — K219 Gastro-esophageal reflux disease without esophagitis: Secondary | ICD-10-CM

## 2023-04-19 DIAGNOSIS — R9389 Abnormal findings on diagnostic imaging of other specified body structures: Secondary | ICD-10-CM | POA: Diagnosis not present

## 2023-04-19 DIAGNOSIS — R0789 Other chest pain: Secondary | ICD-10-CM

## 2023-04-19 DIAGNOSIS — R09A2 Foreign body sensation, throat: Secondary | ICD-10-CM

## 2023-04-19 DIAGNOSIS — R0602 Shortness of breath: Secondary | ICD-10-CM

## 2023-04-19 DIAGNOSIS — Z8709 Personal history of other diseases of the respiratory system: Secondary | ICD-10-CM

## 2023-04-19 DIAGNOSIS — J3501 Chronic tonsillitis: Secondary | ICD-10-CM

## 2023-04-19 MED ORDER — ALBUTEROL SULFATE HFA 108 (90 BASE) MCG/ACT IN AERS: 2.0000 | INHALATION_SPRAY | RESPIRATORY_TRACT | 1 refills | Status: DC | PRN

## 2023-04-19 NOTE — Patient Instructions (Addendum)
Requesting records.   I'm not sure how much of your symptoms are related to your lungs or something else.  We will try a rescue inhaler for a few weeks.  Breathing May use albuterol rescue inhaler 2 puffs every 4 to 6 hours as needed for shortness of breath, chest tightness, coughing, and wheezing.  Monitor frequency of use - if you need to use it more than twice per week on a consistent basis let us know.   Reflux See handout for lifestyle and dietary modifications. Continue pantoprazole 40mg  once day - nothing to eat or drink for 20-30 minutes afterwards.   Follow up in 4 weeks or sooner if needed. Please make sure you follow up with your PCP.

## 2023-04-19 NOTE — Progress Notes (Signed)
New Patient Note  RE: Dave Hernandez MRN: [id number] DOB: 1996/02/11 Date of Office Visit: 04/19/2023  Consult requested by: Sherlie Ban, NP Primary care provider: Bing Neighbors, NP  Chief Complaint: Establish Care (Would like to know allergens. Sometimes in the morning, Dave Hernandez feels like Dave Hernandez needs more oxygen. Does have chest tightness occasionally. Does not recall the nasal spray or allergy medication, but made the back of his tongue swelling and headaches.)  History of Present Illness: Dave Hernandez had the pleasure of seeing Dave Hernandez for initial evaluation at the [facility] on 04/19/2023. Dave Hernandez is a 28 y.o. male, who is referred here by Bing Neighbors, NP for the evaluation of shortness of breath. Dave Hernandez is accompanied today by his girlfriend who provided/contributed to the history.   Discussed the use of AI scribe software for clinical note transcription with the patient, who gave verbal consent to proceed.  Dave Hernandez has been experiencing episodes of shortness of breath and chest tightness over the past two to three weeks. These symptoms occur intermittently, lasting from 10 to 30 minutes, without a clear pattern. No cough, wheezing, or nocturnal symptoms. Dave Hernandez has not sought emergency care for these symptoms. Dave Hernandez has a history of childhood asthma but has not used an inhaler for approximately eight years.  Dave Hernandez notes significant sensitivity to pollen and works in environments with potential allergens such as mold. Allergy testing last year was negative except for a mild reaction to rats, which Dave Hernandez is not exposed to. Dave Hernandez recalls a past reaction to Flonase, which caused tongue swelling, leading to its discontinuation. Dave Hernandez has a known allergy to penicillin, with an unclear childhood reaction, and was monitored in the hospital when given a penicillin-related medication during a past hospitalization. Dave Hernandez had no reaction.   Dave Hernandez has a history of smoking, having quit before his tonsillectomy  last October. Dave Hernandez previously experienced severe chest tightness and difficulty breathing due to e-cigarette use, which required hospitalization. Dave Hernandez denies current use of e-cigarettes or vapes.  Dave Hernandez has a history of gastroesophageal reflux disease (GERD) and is currently taking pantoprazole since May of the previous year, which Dave Hernandez finds helpful. Dave Hernandez reports occasional gas pains and a recent decrease in appetite, but these symptoms have improved.  Dave Hernandez reports a history of pneumonia. Dave Hernandez denies frequent illnesses but notes increased health issues over the past year, including H. pylori infection, which have since resolved. Recent blood work showed low vitamin D and elevated liver enzymes.     History of pneumonia: yes. Smoking exposure: quit in October 2024.  History of reflux: yes and takes pantoprazole 40mg .  10/20/2022 CXR: "IMPRESSION: No active cardiopulmonary disease."  Assessment and Plan: Dave Hernandez is a 28 y.o. male with: Shortness of breath Chest tightness History of asthma Recent onset of intermittent shortness of breath and chest tightness, with no clear triggers. History of childhood asthma, but no inhaler use for several years. Today's spirometry was normal pattern with 2% and 120cc improvement in FEV1 post bronchodilator treatment. Clinically feeling slightly improved. Discussed that his above symptoms may not be all due to his lungs.  We will try a rescue inhaler for a few weeks. May use albuterol rescue inhaler 2 puffs every 4 to 6 hours as needed for shortness of breath, chest tightness, coughing, and wheezing.  Monitor frequency of use - if you need to use it more than twice per week on a consistent basis let us know.   Gastroesophageal reflux disease, unspecified whether  esophagitis present Currently on Pantoprazole. H/o H. Pylori? See handout for lifestyle and dietary modifications. Continue pantoprazole 40mg  once day - nothing to eat or drink for 20-30 minutes afterwards.    Chronic rhinitis Had negative skin testing in the past per patient.  Requesting records from your PCP regarding your past allergy testing.  Return in about 4 weeks (around 05/17/2023). Please make sure you follow up with your PCP regarding the elevated liver enzymes.   Meds ordered this encounter  Medications   albuterol (VENTOLIN HFA) 108 (90 Base) MCG/ACT inhaler    Sig: Inhale 2 puffs into the lungs every 4 (four) hours as needed for wheezing or shortness of breath (coughing fits).    Dispense:  18 g    Refill:  1   Lab Orders  No laboratory test(s) ordered today    Other allergy screening: Rhino conjunctivitis:  watery nose when working outdoors in the cold.  Food allergy: no Medication allergy: yes Not sure - had a reaction as a child.  Hymenoptera allergy: no Urticaria: no Eczema:no History of recurrent infections suggestive of immunodeficency: no  Diagnostics: Spirometry:  Tracings reviewed. His effort: Good reproducible efforts. FVC: 6.26L FEV1: 4.87L, 100% predicted FEV1/FVC ratio: 78% Interpretation: Spirometry consistent with normal pattern with 2% and 120cc improvement in FEV1 post bronchodilator treatment. Clinically feeling slightly improved.   Please see scanned spirometry results for details.  Results discussed with patient/family.   Past Medical History: Patient Active Problem List   Diagnosis Date Noted   Chronic tonsillitis 02/05/2023   Depression with anxiety 07/19/2016   Uncomplicated asthma 07/19/2016   Shortness of breath 07/19/2016   Past Medical History:  Diagnosis Date   Angio-edema    Anxiety    Asthma    as a child, no inhaler   COVID-19    Depression    GERD (gastroesophageal reflux disease)    Headache    History of kidney stones    passed stones, no surgery   Pneumonia    x 1   PONV (postoperative nausea and vomiting)    Seasonal allergies    Past Surgical History: Past Surgical History:  Procedure Laterality Date    TONSILLECTOMY Bilateral 02/01/2023   Procedure: TONSILLECTOMY;  Surgeon: Ashok Croon, MD;  Location: [facility] OR;  Service: ENT;  Laterality: Bilateral;   TONSILLECTOMY     WISDOM TOOTH EXTRACTION     Medication List:  Current Outpatient Medications  Medication Sig Dispense Refill   acetaminophen (TYLENOL) 500 MG tablet Take 1 tablet (500 mg total) by mouth every 6 (six) hours for 2-3 days then take every 6 hours as needed 30 tablet 0   albuterol (VENTOLIN HFA) 108 (90 Base) MCG/ACT inhaler Inhale 2 puffs into the lungs every 4 (four) hours as needed for wheezing or shortness of breath (coughing fits). 18 g 1   Cholecalciferol (VITAMIN D3) 1.25 MG (50000 UT) CAPS Take by mouth.     pantoprazole (PROTONIX) 40 MG tablet Take 40 mg by mouth daily.     No current facility-administered medications for this visit.   Allergies: Allergies  Allergen Reactions   Penicillins Other (See Comments)    Childhood allergy - Patient tolerated Augmentin well in ED without signs of anaphylaxis or any other allergic reaction.    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  Tobacco Use   Smoking status: Former    Current packs/day: 0.00    Types: Cigarettes    Quit date: 2018    Years since quitting: 7.0   Smokeless tobacco: Never  Vaping Use   Vaping status: Former   Quit date: 03/19/2016  Substance and Sexual Activity   Alcohol use: Not Currently    Comment: rarely   Drug use: Yes    Types: Marijuana    Comment: Last use - 11/2022   Sexual activity: Yes    Birth control/protection: Condom  Other Topics Concern   Not on file  Social History Narrative   Not on file   Social Drivers of Health   Financial Resource Strain: Not on file  Food Insecurity: Not on file   Transportation Needs: Not on file  Physical Activity: Not on file  Stress: Not on file  Social Connections: Not on file   Lives in a house. 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, minerals in the house: yes Carpet in the family room: no Carpet in the bedroom: no Heating: gas and electric Cooling: window and fan Pet: yes 5 dogs x  10 yrs  Family History: Family History  Problem Relation Age of Onset   Heart attack Mother    Angioedema Father    Diabetes Father    Prostate cancer Paternal Uncle    Diabetes Paternal Uncle    Heart failure Maternal Grandfather    Breast cancer Paternal Grandmother    Diabetes Paternal Grandfather    Problem                               Relation Asthma                                   no Eczema                                no Food allergy                          no Allergic rhino conjunctivitis     no  Review of Systems  Constitutional:  Negative for appetite change, chills, fever and unexpected weight change.  HENT:  Negative for congestion and rhinorrhea.   Eyes:  Negative for itching.  Respiratory:  Positive for chest tightness and shortness of breath. Negative for cough and wheezing.   Cardiovascular:  Negative for chest pain.  Gastrointestinal:  Positive for abdominal pain.  Genitourinary:  Negative for difficulty urinating.  Skin:  Negative for rash.  Neurological:  Negative for headaches.    Objective: BP 132/88 (BP Location: Right Arm, Patient Position: Sitting, Cuff Size: Normal)   Pulse 90   Temp 99.5 F (37.5 C) (Temporal)   Resp 19   Ht 6' (1.829 m)   Wt 213 lb 3.2 oz (96.7 kg)   SpO2 97%   BMI 28.92 kg/m  Body mass index is 28.92 kg/m. Physical Exam Vitals and nursing note reviewed.  Constitutional:      Appearance: Normal appearance. Dave Hernandez is well-developed.  HENT:     Head: Normocephalic and atraumatic.     Right Ear:  Tympanic membrane and external ear  normal.     Left Ear: Tympanic membrane and external ear normal.     Nose: Nose normal.     Mouth/Throat:     Mouth: Mucous membranes are moist.     Pharynx: Oropharynx is clear.  Eyes:     Conjunctiva/sclera: Conjunctivae normal.  Cardiovascular:     Rate and Rhythm: Normal rate and regular rhythm.     Heart sounds: Normal heart sounds. No murmur heard.    No friction rub. No gallop.  Pulmonary:     Effort: Pulmonary effort is normal.     Breath sounds: Normal breath sounds. No wheezing, rhonchi or rales.  Musculoskeletal:     Cervical back: Neck supple.  Skin:    General: Skin is warm.     Findings: No rash.  Neurological:     Mental Status: Dave Hernandez is alert and oriented to person, place, and time.  Psychiatric:        Behavior: Behavior normal.   The plan was reviewed with the patient/family, and all questions/concerned were addressed.  It was my pleasure to see Dave Hernandez today and participate in his care. Please feel free to contact me with any questions or concerns.  Sincerely,  Wyline Mood, DO Allergy & Immunology  [facility] office: [phone] [facility] office: [phone]

## 2023-04-19 NOTE — Progress Notes (Signed)
ENT Progress Note:  Update 04/19/23  Discussed the use of AI scribe software for clinical note transcription with the patient, who gave verbal consent to proceed.  History of Present Illness   The patient presents with concern regarding tongue swelling.  Tongue swelling was initially noted on a CT scan performed at the end of December, 2024. Two CT scans were conducted, one before and one after surgery, with the latter at [facility]. They experience a sensation of swelling without pain in the tongue area.  The onset of tongue swelling symptoms is associated with the use of allergy medication and nasal spray, which were started a week prior to noticing the swelling. After discontinuing these medications, head pain and pressure symptoms resolved.   The patient presents with tongue swelling.  Tongue swelling was initially noted on a CT scan performed at the end of December. Two CT scans were conducted, one before and one after surgery, with the latter at [facility]. The results of the post-surgery scan are not available in the current records. They experience a sensation of swelling without pain in the tongue area.  The onset of tongue swelling symptoms is associated with the use of allergy medication and nasal spray, which were started a week prior to noticing the swelling. After discontinuing these medications, head pain and pressure symptoms resolved, indicating a possible allergic reaction.  Recently, they have been experiencing side pain, described as similar to gas pain. They have already consulted their primary care doctor about this issue earlier in the week. Records reviewed  Update 02/22/23 S/p tonsillectomy for chronic tonsillitis and previously noted right tonsillar cyst, here for post-op f/u. Reports his throat pain is better now and he is eating regular but soft diet currently. No bleeding episodes. He had some ear discomfort with pain with talking initially, but it got better. Has  mild right temple and frontal headache. Has had some pain along the right face. He has hx of bruxism.   Update 01/21/23: Returns for follow-up.  Finished antibiotics.  He had another visit to the ER 01/12/2023 due to recurrent throat discomfort and sensation of drainage from the lower right tonsillar cyst.  He also had recurrent swelling along the right face and neck pain on the right side.  He admits to doing physical work prior to onset of neck discomfort. He was started on Naproxen. No dental pain or infected teeth.  While in ED he had overall unremarkable CT neck with contrast which did not demonstrate peritonsillar abscess but did show previously seen right tonsillar cyst.  \ ED visit note 01/12/2023 Patient has a history of depression and anxiety kidney stones pneumonia and a recent diagnosis of a tonsillar abscess. Patient was seen in the emergency room on October 20. He was complaining of sore throat and had a CT scan of his neck. The CT scan showed a small less than 1 cm fluid collection along the lateral oropharyngeal wall at the inferior aspect of the right palate teen tonsil. Patient followed up with ENT Dave Hernandez on October 21. Plan was to continue Augmentin and steroids. No indication for incision and drainage patient was instructed if any worsening symptoms to return to the ED. Patient states this morning he felt like he felt the bump on the side of his neck. He also felt like there may have been some swelling on the right side of his face and he was having some headache and discomfort on the right side of his head. He  has not had any fevers or chills. He is not having any difficulty swallowing.   Initial evaluation by me Reason for Consult: Right tonsillar collection with drainage  HPI: Dave Hernandez is an 28 y.o. male with hx of long-standing sore throat and a cyst, that appears to drain on the right side of his throat. At times he is able to taste the drainage, when it drains on  its own. He reports that 1-2 months ago he developed left ear pressure, went to ED and at first was diagnosed with an ear infection. It started to clear up. He then developed an abscess right lower mandibular area, cleared with Clindamycin and it was thought to be dental in etiology. Then his left ear pain returned. Two weeks ago he developed sore throat again, would get better with Aleve. His sore throat became more persistent later on, and when he looked at his throat himself he noticed the "abscess" on the right side of his throat. He was given Z-pack. The next day he went to urgent care 2/2 persistent sx, and was told to take Clindamycin. He was seen in ED yesterday and was started on prednisone and Augmentin.  He denies fever, he denies trouble with swallowing. He feels his voice is hoarse. Denies poor dentiition.     Records Reviewed:  ED note from yesterday Briefly: Patient is 28 y.o. "presents today for evaluation of sore throat.  Patient reports that he can see an abscess in his throat.  He endorses cold/chills.  Denies any fever.  Denies any issue with eating, drinking or breathing.  He was given clindamycin for the abscess however he reports no improvement."   DDX: concern for  strep, peritonsillar abscess, retropharyngeal abscess, epiglottitis infectious etiology    Plan:  - Area of swelling and draining has been present for 6 days. Patient started on Azithromycin on the first day which was changed to Clindamycin Friday afternoon. States that there has been no resolution in swelling. No difficulty swallowing or breathing - is spitting in bag because patient states that he can taste the purulence.  - obtaining swab of area for culture.  - CT showing small (9 x 7 x 5 mm) peripherally enhancing fluid collection along the superficial/mucosal surface of the right lateral oropharyngeal wall at the inferior aspect of the right palatine tonsil. This could represent a small abscess or cyst  (potentially infected). While thought less likely, recommend correlation with direct inspection to exclude malignancy. - Consulted with ENT (Dave Hernandez) who recommends Augmentin and steroids and would like to see patient in office tomorrow morning. Shared information with patient who verbalized understanding of plan.  - patient stating that he had an allergic reaction to penicillin as an infant - is not able to provide the reaction. Patient talked with his father who is also not able to remember the allergic reaction that patient had to penicillin. Provided patient with a dose of Augmentin ED which he tolerated well. Sent rest of prescription to pharmacy. - patient afebrile with stable vitals. Provided with return precautions. Discharged in good condition. Answered patient's questions.     Past Medical History:  Diagnosis Date   Angio-edema    Anxiety    Asthma    as a child, no inhaler   COVID-19    Depression    GERD (gastroesophageal reflux disease)    Headache    History of kidney stones    passed stones, no surgery   Pneumonia  x 1   PONV (postoperative nausea and vomiting)    Seasonal allergies     Past Surgical History:  Procedure Laterality Date   TONSILLECTOMY Bilateral 02/01/2023   Procedure: TONSILLECTOMY;  Surgeon: Ashok Croon, MD;  Location: [facility] OR;  Service: ENT;  Laterality: Bilateral;   TONSILLECTOMY     WISDOM TOOTH EXTRACTION      Family History  Problem Relation Age of Onset   Heart attack Mother    Angioedema Father    Diabetes Father    Prostate cancer Paternal Uncle    Diabetes Paternal Uncle    Heart failure Maternal Grandfather    Breast cancer Paternal Grandmother    Diabetes Paternal Grandfather     Social History:  reports that he quit smoking about 7 years ago. His smoking use included cigarettes. He has never used smokeless tobacco. He reports that he does not currently use alcohol. He reports current drug use. Drug:  Marijuana.  Allergies:  Allergies  Allergen Reactions   Penicillins Other (See Comments)    Childhood allergy - Patient tolerated Augmentin well in ED without signs of anaphylaxis or any other allergic reaction.     Medications: I have reviewed the patient's current medications.  The PMH, PSH, Medications, Allergies, and SH were reviewed and updated.  ROS: Constitutional: Negative for fever, weight loss and weight gain. Cardiovascular: Negative for chest pain and dyspnea on exertion. Respiratory: Is not experiencing shortness of breath at rest. Gastrointestinal: Negative for nausea and vomiting. Neurological: Negative for headaches. Psychiatric: The patient is not nervous/anxious  Blood pressure (!) 154/84, pulse 88, SpO2 96%.  PHYSICAL EXAM:  Exam: General: Well-developed, well-nourished Respiratory Respiratory effort: Equal inspiration and expiration without stridor Cardiovascular Peripheral Vascular: Warm extremities with equal color/perfusion Eyes: No nystagmus with equal extraocular motion bilaterally Neuro/Psych/Balance: Patient oriented to person, place, and time; Appropriate mood and affect; Gait is intact with no imbalance; Cranial nerves I-XII are intact Head and Face Inspection: Normocephalic and atraumatic without mass or lesion Facial Strength: Facial motility symmetric and full bilaterally ENT Pinna: External ear intact and fully developed External canal: Canal is patent with intact skin Tympanic Membrane: Clear and mobile External Nose: No scar or anatomic deformity Internal Nose: Septum is deviated to the left on anterior rhinoscopy Bilateral inferior turbinate hypertrophy.  Lips, Teeth, and gums: Mucosa and teeth intact and viable TMJ: Tender to palpation R side with full mobility Oral cavity/oropharynx: No exudate, no lesions present. S/p tonsillectomy, healed completely following tonsillectomy Neck Neck and Trachea: Midline trachea without mass or  lesion  Preoperative diagnosis: globus sensation, sensation of tongue fullness  Postoperative diagnosis:   Same + GERD LPR  Procedure: Flexible fiberoptic laryngoscopy  Surgeon: Ashok Croon, MD  Anesthesia: Topical lidocaine and Afrin Complications: None Condition is stable throughout exam  Indications and consent:  The patient presents to the clinic with Indirect laryngoscopy view was incomplete. Thus it was recommended that they undergo a flexible fiberoptic laryngoscopy. All of the risks, benefits, and potential complications were reviewed with the patient preoperatively and verbal informed consent was obtained.  Procedure: The patient was seated upright in the clinic. Topical lidocaine and Afrin were applied to the nasal cavity. After adequate anesthesia had occurred, I then proceeded to pass the flexible telescope into the nasal cavity. The nasal cavity was patent without rhinorrhea or polyp. The nasopharynx was also patent without mass or lesion. The base of tongue was visualized and was normal. There were no signs of pooling of secretions in  the piriform sinuses. The true vocal folds were mobile bilaterally. There were no signs of glottic or supraglottic mucosal lesion or mass. There was moderate interarytenoid pachydermia and post cricoid edema. The telescope was then slowly withdrawn and the patient tolerated the procedure throughout.   Studies Reviewed: CT neck with contrast 01/06/23  FINDINGS: Pharynx and larynx: Small (9 x 7 x 5 mm) peripherally enhancing fluid collection along the superficial/mucosal surface of the right lateral oropharyngeal wall at the inferior aspect of the right palatine tonsil (see series 3, image 62 and series 6, image 32).   Salivary glands: No inflammation, mass, or stone.   Thyroid: Normal.   Lymph nodes: None enlarged or abnormal density.   Vascular: Not well evaluated on this study due to non arterial contrast timing.   Limited  intracranial: Negative.   Visualized orbits: Negative.   Mastoids and visualized paranasal sinuses: Clear.   Skeleton: No acute or aggressive process.   Upper chest: Visualized lung apices are clear.   IMPRESSION: Small (9 x 7 x 5 mm) peripherally enhancing fluid collection along the superficial/mucosal surface of the right lateral oropharyngeal wall at the inferior aspect of the right palatine tonsil. This could represent a small abscess or cyst (potentially infected). While thought less likely, recommend correlation with direct inspection to exclude malignancy.  01/12/23 IMPRESSION: 1. Unchanged mild asymmetry of the right lower palatine tonsil, which is more likely a benign retention cyst rather than sequelae of tonsillar infection given absence of parapharyngeal inflammation, lymphadenopathy, or other acute findings. 2. Otherwise stable and negative CT appearance of the Neck  CT neck 03/14/23 CT MAXILLOFACIAL FINDINGS   Osseous: No fracture or mandibular dislocation. No destructive process.   Sinuses/Orbits: Paranasal sinuses and mastoid air cells are clear. The orbits are unremarkable.   Soft tissues: Negative.   CT CERVICAL SPINE FINDINGS   Alignment: Normal.   Skull base and vertebrae: No acute fracture. No aggressive appearing focal osseous lesion or focal pathologic process.   Soft tissues and spinal canal: No prevertebral fluid or swelling. No visible canal hematoma.   Upper chest: Unremarkable.   Other: Question slight asymmetry and possible induration of the base of the tongue (4:63).   IMPRESSION: 1.  No acute intracranial abnormality. 2.  No acute displaced facial fracture. 3. No acute displaced fracture or traumatic listhesis of the cervical spine. 4. Question slight asymmetry and possible induration of the base of the tongue. Recommend correlation with direct visualization. 5. Previously visualized tonsillar cyst not well visualized  with limited evaluation on this noncontrast study.  Assessment/Plan: Encounter Diagnoses  Name Primary?   Chronic tonsillitis Yes   Abnormal CT scan, neck    Globus sensation [R09.A2]       Chronic sore throat pain with swallowing concern for tonsillar abscess.  I reviewed CT neck imaging and it appears to have well circumcised mildly enhancing lesion next to the inferior area of the right tonsil which is consistent with right tonsillar cyst seen on flexible laryngoscopy and oropharyngeal exam today  2.  Chronic tonsillitis history of tonsillar hypertrophy -I suspect most of his sore throat and throat pain symptoms are due to chronic tonsillitis and we discussed tonsillectomy -Risks and benefits of tonsillectomy were discussed today he will consider in the future if symptoms persist -He will finish Augmentin and steroid course given to him in ED yesterday -We discussed how management of postnasal drainage and GERD LPR can improve sore throat symptoms -Gargle with salt water  3.  GERD LPR -Continue Pepcid 20 mg daily -Diet and lifestyle changes to minimize reflux  4.  Nasal congestion suspect environmental allergies -Continue daily antihistamine and Flonase 2 puffs twice daily bilateral nares  -He will return in a few weeks for symptom check and to discuss tonsillectomy if indicated at the time  Update 01/21/23 Had another visit to ED for neck pain, persistent throat discomfort. Repeat CT neck with no abscess, evidence of previously seen right tonsillar cyst. Otherwise unremarkable.   Chronic sore throat - improved after abx, but still has sensation of tonsillar cyst draining mucus  2.  Chronic tonsillitis history of tonsillar hypertrophy -I suspect most of his sore throat and throat pain symptoms are due to chronic tonsillitis and we discussed tonsillectomy -Risks and benefits of tonsillectomy were discussed today and he would like to proceed - will book for tonsillectomy  3.   GERD LPR -Continue Pepcid 20 mg daily -Diet and lifestyle changes to minimize reflux - trial of reflux gourmet   4.  Nasal congestion suspect environmental allergies -Continue daily antihistamine and Flonase 2 puffs twice daily bilateral nares  5. Neck pain along right SCM area (superior aspect) no palpable masses on exam - likely musculoskeletal  - continue Naproxen as needed and consider massage therapy   6. 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 He was tender on exam, without trismus. Oropharynx appears completely healed at this point. No bleeding, he is eating regular diet.   Chronic tonsillitis and sore throat/tonsillar cyst  - s/p tonsillectomy no issues post-op   GERD LPR -Continue Pepcid 20 mg daily -Diet and lifestyle changes to minimize reflux - trial of reflux gourmet   Nasal congestion suspect environmental allergies -Continue daily antihistamine and Flonase 2 puffs twice daily bilateral nares  Neck pain along right SCM area (superior aspect) no palpable masses on exam - likely musculoskeletal - currently better - continue Naproxen as needed and consider massage therapy   Recurrent R facial swelling/pain - likely TMJ related - no salivary gland swelling or mass on exam today and good dentition but tender at the R TMJ - we discussed preventative care and supportive care  - had negative CT x 2 which we reviewed before   Update 04/19/23 Assessment and Plan    Concern regarding findings of CT neck - "Question slight asymmetry and possible induration of the base of the tongue. Recommend correlation with direct visualization."  No  current pain, but some perceived fullness in the area, Flexible scope exam and oropharyngeal exam were normal today. Previous findings were nonspecific.  - patient reassured  - RTC as needed        Ashok Croon, MD Otolaryngology [facility] Phone: [phone] Fax: [phone]    04/19/2023, 2:16 PM

## 2023-04-22 MED ORDER — ALBUTEROL SULFATE HFA 108 (90 BASE) MCG/ACT IN AERS: 2.0000 | INHALATION_SPRAY | RESPIRATORY_TRACT | 0 refills | Status: AC | PRN

## 2023-04-22 NOTE — Telephone Encounter (Signed)
Sent in new rx with memo - dispense whatever albuterol is covered.

## 2023-05-07 ENCOUNTER — Other Ambulatory Visit: Payer: Self-pay | Admitting: Nurse Practitioner

## 2023-05-07 DIAGNOSIS — R1032 Left lower quadrant pain: Secondary | ICD-10-CM

## 2023-05-15 ENCOUNTER — Ambulatory Visit: Payer: Commercial Managed Care - HMO | Admitting: Allergy

## 2023-05-17 ENCOUNTER — Ambulatory Visit
Admission: RE | Admit: 2023-05-17 | Discharge: 2023-05-17 | Disposition: A | Discharge status: Home / Self Care | Payer: Commercial Managed Care - HMO | Source: Ambulatory Visit | Attending: Nurse Practitioner | Admitting: Nurse Practitioner

## 2023-05-17 DIAGNOSIS — R1032 Left lower quadrant pain: Secondary | ICD-10-CM

## 2023-05-17 MED ORDER — IOPAMIDOL (ISOVUE-300) INJECTION 61%
100.0000 mL | Freq: Once | INTRAVENOUS | Status: AC | PRN
  Administered 2023-05-17: 100 mL via INTRAVENOUS

## 2023-06-06 ENCOUNTER — Encounter (HOSPITAL_COMMUNITY): Payer: Self-pay

## 2023-06-06 ENCOUNTER — Other Ambulatory Visit: Payer: Self-pay

## 2023-06-06 ENCOUNTER — Emergency Department (HOSPITAL_COMMUNITY)

## 2023-06-06 ENCOUNTER — Emergency Department (HOSPITAL_COMMUNITY)
Admission: EM | Admit: 2023-06-06 | Discharge: 2023-06-06 | Disposition: A | Discharge status: Home / Self Care | Attending: Emergency Medicine | Admitting: Emergency Medicine

## 2023-06-06 DIAGNOSIS — J45909 Unspecified asthma, uncomplicated: Secondary | ICD-10-CM | POA: Diagnosis not present

## 2023-06-06 DIAGNOSIS — K2900 Acute gastritis without bleeding: Secondary | ICD-10-CM | POA: Diagnosis not present

## 2023-06-06 DIAGNOSIS — R0789 Other chest pain: Secondary | ICD-10-CM

## 2023-06-06 DIAGNOSIS — R072 Precordial pain: Secondary | ICD-10-CM | POA: Insufficient documentation

## 2023-06-06 LAB — TROPONIN I (HIGH SENSITIVITY): Troponin I (High Sensitivity): 4 ng/L (ref <18)

## 2023-06-06 LAB — CBC
HCT: 46.7 % (ref 39.0–52.0)
Hemoglobin: 16 g/dL (ref 13.0–17.0)
MCH: 29.7 pg (ref 26.0–34.0)
MCHC: 34.3 g/dL (ref 30.0–36.0)
MCV: 86.6 fL (ref 80.0–100.0)
Platelets: 264 10*3/uL (ref 150–400)
RBC: 5.39 MIL/uL (ref 4.22–5.81)
RDW: 12.7 % (ref 11.5–15.5)
WBC: 4.7 10*3/uL (ref 4.0–10.5)
nRBC: 0 % (ref 0.0–0.2)

## 2023-06-06 LAB — BASIC METABOLIC PANEL
Anion gap: 8 (ref 5–15)
BUN: 8 mg/dL (ref 6–20)
CO2: 26 mmol/L (ref 22–32)
Calcium: 9.2 mg/dL (ref 8.9–10.3)
Chloride: 102 mmol/L (ref 98–111)
Creatinine, Ser: 1.02 mg/dL (ref 0.61–1.24)
GFR, Estimated: >60 mL/min (ref >60)
Glucose, Bld: 100 mg/dL — ABNORMAL HIGH (ref 70–99)
Potassium: 4 mmol/L (ref 3.5–5.1)
Sodium: 136 mmol/L (ref 135–145)

## 2023-06-06 MED ORDER — LIDOCAINE VISCOUS HCL 2 % MT SOLN
15.0000 mL | Freq: Once | OROMUCOSAL | Status: AC
  Administered 2023-06-06: 15 mL via ORAL
  Filled 2023-06-06: qty 15

## 2023-06-06 MED ORDER — SUCRALFATE 1 G PO TABS: 1.0000 g | ORAL_TABLET | Freq: Three times a day (TID) | ORAL | 0 refills | Status: DC

## 2023-06-06 MED ORDER — SUCRALFATE 1 G PO TABS
1.0000 g | ORAL_TABLET | Freq: Once | ORAL | Status: AC
  Administered 2023-06-06: 1 g via ORAL
  Filled 2023-06-06: qty 1

## 2023-06-06 MED ORDER — ALUM & MAG HYDROXIDE-SIMETH 200-200-20 MG/5ML PO SUSP
30.0000 mL | Freq: Once | ORAL | Status: AC
  Administered 2023-06-06: 30 mL via ORAL
  Filled 2023-06-06: qty 30

## 2023-06-06 NOTE — ED Triage Notes (Signed)
 Pt came in via POV d/t intermittent central CP/burning that started a couple of days ago, it felt warm & started 20 min after he ate. Denies any radiation of pain. Does states that with exertion is hurts & when he lays down it helps but the burning will persist. Yesterday he did try TUMS & Mylanta with some relief but no complete alleviation of pain. Does have Hx of stomach ulcers & recently when he takes a deep breath it causes his chest to hurt as well. A/Ox4, rates his pain 6/10 during triage.

## 2023-06-06 NOTE — ED Provider Notes (Signed)
 [name] Provider Note   CSN: [id number] Arrival date & time: 06/06/23  1022     History  Chief Complaint  Patient presents with   Chest Pain    Dave Hernandez is a 28 y.o. male.  The history is provided by the patient, medical records and the spouse. No language interpreter was used.  Chest Pain Pain location:  Substernal area Pain quality: burning   Pain radiates to:  Epigastrium Pain severity:  Moderate Onset quality:  Gradual Duration:  2 days Timing:  Constant Progression:  Waxing and waning Chronicity:  New Context: eating (after eating spicy food)   Relieved by:  Antacids Worsened by:  Certain positions Ineffective treatments:  None tried Associated symptoms: no abdominal pain, no altered mental status, no anorexia, no back pain, no cough, no diaphoresis, no dizziness, no dysphagia, no fatigue, no fever, no headache, no nausea, no near-syncope, no numbness, no palpitations, no shortness of breath, no syncope, no vomiting and no weakness   Risk factors: male sex        Home Medications Prior to Admission medications   Medication Sig Start Date End Date Taking? Authorizing Provider  acetaminophen (TYLENOL) 500 MG tablet Take 1 tablet (500 mg total) by mouth every 6 (six) hours for 2-3 days then take every 6 hours as needed 02/01/23   Ashok Croon, MD  albuterol (VENTOLIN HFA) 108 (90 Base) MCG/ACT inhaler Inhale 2 puffs into the lungs every 4 (four) hours as needed for wheezing or shortness of breath (coughing fits). 04/22/23   Ellamae Sia, DO  Cholecalciferol (VITAMIN D3) 1.25 MG (50000 UT) CAPS Take by mouth. 04/08/23   [provider]  pantoprazole (PROTONIX) 40 MG tablet Take 40 mg by mouth daily.    [provider]      Allergies    Penicillins    Review of Systems   Review of Systems  Constitutional:  Negative for diaphoresis, fatigue and fever.  HENT:  Negative for congestion and  trouble swallowing.   Eyes:  Negative for visual disturbance.  Respiratory:  Negative for cough, chest tightness, shortness of breath, wheezing and stridor.   Cardiovascular:  Positive for chest pain. Negative for palpitations, leg swelling, syncope and near-syncope.  Gastrointestinal:  Negative for abdominal pain, anorexia, constipation, diarrhea, nausea and vomiting.  Genitourinary:  Negative for dysuria, flank pain and frequency.  Musculoskeletal:  Negative for back pain, neck pain and neck stiffness.  Skin:  Negative for rash and wound.  Neurological:  Negative for dizziness, weakness, light-headedness, numbness and headaches.  Psychiatric/Behavioral:  Negative for agitation and confusion.   All other systems reviewed and are negative.   Physical Exam Updated Vital Signs BP 134/84 (BP Location: Right Arm)   Pulse 65   Temp 97.9 F (36.6 C) (Oral)   Resp 17   Ht 6\' 2"  (1.88 m)   Wt 95.3 kg   SpO2 100%   BMI 26.96 kg/m  Physical Exam Vitals and nursing note reviewed.  Constitutional:      General: He is not in acute distress.    Appearance: He is well-developed. He is not ill-appearing, toxic-appearing or diaphoretic.  HENT:     Head: Normocephalic and atraumatic.  Eyes:     Conjunctiva/sclera: Conjunctivae normal.     Pupils: Pupils are equal, round, and reactive to light.  Cardiovascular:     Rate and Rhythm: Normal rate and regular rhythm.     Heart sounds: Normal heart  sounds. No murmur heard. Pulmonary:     Effort: Pulmonary effort is normal. No respiratory distress.     Breath sounds: Normal breath sounds. No decreased breath sounds, wheezing, rhonchi or rales.  Chest:     Chest wall: No tenderness.  Abdominal:     Palpations: Abdomen is soft.     Tenderness: There is no abdominal tenderness.  Musculoskeletal:        General: No swelling.     Cervical back: Neck supple.     Right lower leg: No tenderness. No edema.     Left lower leg: No tenderness. No edema.   Skin:    General: Skin is warm and dry.     Capillary Refill: Capillary refill takes less than 2 seconds.     Findings: No erythema.  Neurological:     General: No focal deficit present.     Mental Status: He is alert.  Psychiatric:        Mood and Affect: Mood normal.     ED Results / Procedures / Treatments   Labs (all labs ordered are listed, but only abnormal results are displayed) Labs Reviewed  BASIC METABOLIC PANEL - Abnormal; Notable for the following components:      Result Value   Glucose, Bld 100 (*)    All other components within normal limits  CBC  TROPONIN I (HIGH SENSITIVITY)  TROPONIN I (HIGH SENSITIVITY)    EKG EKG Interpretation Date/Time:  Thursday June 06 2023 10:56:47 EDT Ventricular Rate:  71 PR Interval:  144 QRS Duration:  96 QT Interval:  370 QTC Calculation: 402 R Axis:   144  Text Interpretation: Normal sinus rhythm Right axis deviation RSR' or QR pattern in V1 suggests right ventricular conduction delay T wave abnormality, consider inferior ischemia Abnormal ECG When compared with ECG of 20-Oct-2022 10:20, PREVIOUS ECG IS PRESENT when compared to prior, similar appearance No STEMI Confirmed by Theda Belfast ([id number]) on 06/06/2023 2:44:11 PM  Radiology DG Chest 2 View Result Date: 06/06/2023 CLINICAL DATA:  Chest pain for the past few days. EXAM: CHEST - 2 VIEW COMPARISON:  Chest x-ray dated October 20, 2022. FINDINGS: The heart size and mediastinal contours are within normal limits. Both lungs are clear. The visualized skeletal structures are unremarkable. IMPRESSION: No active cardiopulmonary disease. Electronically Signed   By: Obie Dredge M.D.   On: 06/06/2023 12:27    Procedures Procedures    Medications Ordered in ED Medications  alum & mag hydroxide-simeth (MAALOX/MYLANTA) 200-200-20 MG/5ML suspension 30 mL (30 mLs Oral Given 06/06/23 1520)    And  lidocaine (XYLOCAINE) 2 % viscous mouth solution 15 mL (15 mLs Oral Given 06/06/23  1520)  sucralfate (CARAFATE) tablet 1 g (1 g Oral Given 06/06/23 1520)    ED Course/ Medical Decision Making/ A&P                                 Medical Decision Making Amount and/or Complexity of Data Reviewed Labs: ordered. Radiology: ordered.  Risk OTC drugs. Prescription drug management.    Dave Hernandez is a 28 y.o. male with a past medical history significant for asthma, kidney stones, depression, anxiety, and previously elevated troponin who presents with chest discomfort.  Patient reports that several days ago he had pizza followed by a spicy taco with a green hot sauce on it that was extremely spicy.  He reports that he ate it and then about  20 months later started having this burning chest discomfort.  It has bothered him for the last few days and is especially worse at night when he lays flat.  He feels it is a burning coming from his epigastric area up towards his chest.  He reports no real shortness of breath at this time although he does say over the last few months he has had some intermittent exertional shortness of breath.  Denies any leg pain or leg swelling.  Reports no sharp discomfort.  Denies any nausea, vomiting, or constipation but had to have burning diarrhea that was explosive after the taco.  He was concerned about possible food poisoning as well.  Otherwise due to his history of elevated troponin and possible heart injury in the past, he went to make sure it was not his heart.  Denies history of blood clots or any trauma.  On exam, lungs clear.  Chest nontender.  Abdomen nontender.  No murmur.  Patient extremely well-appearing and vital signs reassuring.  EKG does not show STEMI and appeared similar to prior.  Patient had troponin x 2 that were negative.  X-ray reassuring.  The patient was given a GI cocktail as a suspect this is a gastritis or esophagitis after the spicy food and is already sensitive GI tract.  He is unsure what feels similar to when he had  his ulcer or not but he denies any blood in his emesis or stool.  After the GI cocktail the pain completely resolved.  He is feeling much better.  He otherwise had reassuring workup.  Given his well appearance and vitals we agreed we have a low suspicion for a thromboembolic etiology as he is PERC negative.  Will have him follow-up with outpatient cardiology given the history of heart injury and this shortness of breath he has been having although I do not suspect a cardiac cause of his symptoms today.  I suspect is related to the spicy food causing this burning discomfort.  He will also follow-up with his GI doctor.  He is already on acid medication but will give Carafate as that seem to help today as well.  He will rest and stay hydrated and have a bland diet.  He understood return precautions and follow-up instructions and was discharged in good condition.          Final Clinical Impression(s) / ED Diagnoses Final diagnoses:  Burning chest pain  Acute gastritis, presence of bleeding unspecified, unspecified gastritis type  Atypical chest pain    Rx / DC Orders ED Discharge Orders          Ordered    sucralfate (CARAFATE) 1 g tablet  3 times daily with meals & bedtime        06/06/23 1630           Clinical Impression: 1. Burning chest pain   2. Acute gastritis, presence of bleeding unspecified, unspecified gastritis type   3. Atypical chest pain     Disposition: Discharge  Condition: Good  I have discussed the results, Dx and Tx plan with the pt(& family if present). He/she/they expressed understanding and agree(s) with the plan. Discharge instructions discussed at great length. Strict return precautions discussed and pt &/or family have verbalized understanding of the instructions. No further questions at time of discharge.    Discharge Medication List as of 06/06/2023  4:33 PM     START taking these medications   Details  sucralfate (CARAFATE) 1 g tablet Take 1  tablet (1 g total) by mouth 4 (four) times daily -  with meals and at bedtime., Starting Thu 06/06/2023, Print        Follow Up: Sherlie Ban, [place] [phone] [place] [address] [phone]     your gastroenterologist     [employer] [facility] A DEPT OF [facility] [address] [place] [address] [phone]        Meiko Ives, Canary Brim, MD 06/06/23 [phone]

## 2023-06-06 NOTE — Discharge Instructions (Signed)
 Your history, exam, and workup today was overall reassuring and consistent with suspected gastritis and esophagus irritation causing your burning pain in your chest and upper abdomen.  I suspect it was triggered by the pizza followed by the spicy green hot sauce on the tacos that led to your symptoms.  Your workup did not show acute abnormality on your chest x-ray and your cardiac enzymes were negative both times we checked them.  Your EKG appeared similar to prior and I have low suspicion for a cardiac cause of your symptoms at this time.  Given your history of some cardiac issues and your shortness of breath it is reasonable to have you follow-up with outpatient cardiology but I more so when she to follow-up with your gastroenterologist to get seen sooner for evaluation.  You are already on medication for your stomach acid but please consider adding the Carafate temporarily to help with the stomach and esophagus lining that I suspect is irritated.  Your hemoglobin was normal and I doubt acute hemorrhage or ulceration at this time.  The rest of your workup was reassuring.  We feel you are safe for discharge home.  Please follow-up with the cardiology and GI team and PCP and rest.  Please avoid greasy and spicy foods.  If any symptoms change or worsen acutely, please return to the nearest emergency department.

## 2023-06-07 ENCOUNTER — Ambulatory Visit: Admitting: Gastroenterology

## 2023-06-07 ENCOUNTER — Encounter: Payer: Self-pay | Admitting: Gastroenterology

## 2023-06-07 VITALS — BP 144/82 | HR 86 | Ht 74.0 in | Wt 208.6 lb

## 2023-06-07 DIAGNOSIS — Z8619 Personal history of other infectious and parasitic diseases: Secondary | ICD-10-CM

## 2023-06-07 DIAGNOSIS — R1013 Epigastric pain: Secondary | ICD-10-CM | POA: Diagnosis not present

## 2023-06-07 DIAGNOSIS — K219 Gastro-esophageal reflux disease without esophagitis: Secondary | ICD-10-CM | POA: Diagnosis not present

## 2023-06-07 NOTE — Progress Notes (Signed)
 Chief Complaint: Hospital follow-up Primary GI MD: Gentry Fitz  HPI: Discussed the use of AI scribe software for clinical note transcription with the patient, who gave verbal consent to proceed.  History of Present Illness   Dave Hernandez is a 28 year old male with gastroesophageal reflux disease who presents with chest pain after eating spicy food.  He experiences severe chest pain after consuming spicy foods, such as tacos with green hot sauce, pizza, and lime. The pain is described as one of the worst feelings he has ever had, initially fearing it was a heart attack. It began approximately 15 minutes after eating and persisted despite taking Melanta and Tums. The pain worsens when lying down, preventing sleep until 2 AM.  He has a history of gastroesophageal reflux disease and is currently taking pantoprazole 40 mg once daily in the morning. Despite this, he continues to experience symptoms, particularly after consuming certain foods.  He recalls a previous diagnosis of H. pylori for which he was treated, but he is unsure if it has recurred. He underwent a CT scan last year and recently, both of which were normal.   He mentions trying to watch his diet by avoiding soda and limiting his food intake to once a day, typically eating a banana in the morning and a meal in the afternoon.     At our last office visit August 2024 we did not have insurance so he could not undergo EGD or stool study testing.  He presents today with insurance and is ready for further workup.  PREVIOUS GI WORKUP   CT chest abdomen pelvis with contrast 05/19/2023 with no acute abnormality.  Normal stomach, bowel, liver, pancreas, gallbladder.  Incidental kidney stone  Past Medical History:  Diagnosis Date   Angio-edema    Anxiety    Asthma    as a child, no inhaler   COVID-19    Depression    GERD (gastroesophageal reflux disease)    Headache    History of kidney stones    passed stones, no surgery    Pneumonia    x 1   PONV (postoperative nausea and vomiting)    Seasonal allergies     Past Surgical History:  Procedure Laterality Date   TONSILLECTOMY Bilateral 02/01/2023   Procedure: TONSILLECTOMY;  Surgeon: Ashok Croon, MD;  Location: [facility] OR;  Service: ENT;  Laterality: Bilateral;   TONSILLECTOMY     WISDOM TOOTH EXTRACTION      Current Outpatient Medications  Medication Sig Dispense Refill   acetaminophen (TYLENOL) 500 MG tablet Take 1 tablet (500 mg total) by mouth every 6 (six) hours for 2-3 days then take every 6 hours as needed 30 tablet 0   albuterol (VENTOLIN HFA) 108 (90 Base) MCG/ACT inhaler Inhale 2 puffs into the lungs every 4 (four) hours as needed for wheezing or shortness of breath (coughing fits). 18 g 0   Cholecalciferol (VITAMIN D3) 1.25 MG (50000 UT) CAPS Take by mouth.     dicyclomine (BENTYL) 20 MG tablet Take 20 mg by mouth 3 (three) times daily as needed.     pantoprazole (PROTONIX) 40 MG tablet Take 40 mg by mouth daily.     sertraline (ZOLOFT) 25 MG tablet Take 25 mg by mouth daily.     sucralfate (CARAFATE) 1 g tablet Take 1 tablet (1 g total) by mouth 4 (four) times daily -  with meals and at bedtime. (Patient not taking: Reported on 06/07/2023) 30 tablet 0   No current  facility-administered medications for this visit.    Allergies as of 06/07/2023 - Review Complete 06/07/2023  Allergen Reaction Noted   Penicillins Other (See Comments) 07/17/2016    Family History  Problem Relation Age of Onset   Heart attack Mother    Angioedema Father    Diabetes Father    Prostate cancer Paternal Uncle    Diabetes Paternal Uncle    Heart failure Maternal Grandfather    Breast cancer Paternal Grandmother    Diabetes Paternal Grandfather     Social History   Socioeconomic History   Marital status: Single    Spouse name: Not on file   Number of children: 0   Years of education: Not on file   Highest education level: Not on file  Occupational History    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  Tobacco Use   Smoking status: Former    Current packs/day: 0.00    Types: Cigarettes    Quit date: 2018    Years since quitting: 7.2   Smokeless tobacco: Never  Vaping Use   Vaping status: Former   Quit date: 03/19/2016  Substance and Sexual Activity   Alcohol use: Not Currently    Comment: rarely   Drug use: Yes    Types: Marijuana    Comment: Last use - 11/2022   Sexual activity: Yes    Birth control/protection: Condom  Other Topics Concern   Not on file  Social History Narrative   Not on file   Social Drivers of Health   Financial Resource Strain: Not on file  Food Insecurity: Not on file  Transportation Needs: Not on file  Physical Activity: Not on file  Stress: Not on file  Social Connections: Not on file  Intimate Partner Violence: Not on file    Review of Systems:    Constitutional: No weight loss, fever, chills, weakness or fatigue HEENT: Eyes: No change in vision               Ears, Nose, Throat:  No change in hearing or congestion Skin: No rash or itching Cardiovascular: No chest pain, chest pressure or palpitations   Respiratory: No SOB or cough Gastrointestinal: See HPI and otherwise negative Genitourinary: No dysuria or change in urinary frequency Neurological: No headache, dizziness or syncope Musculoskeletal: No new muscle or joint pain Hematologic: No bleeding or bruising Psychiatric: No history of depression or anxiety    Physical Exam:  Vital signs: BP (!) 144/82 (BP Location: Right Arm, Patient Position: Sitting, Cuff Size: Normal)   Pulse 86   Ht 6\' 2"  (1.88 m)   Wt 208 lb 9.6 oz (94.6 kg)   BMI 26.78 kg/m   Constitutional: NAD, Well developed, Well nourished, alert and cooperative Head:  Normocephalic and atraumatic. Eyes:   PEERL, EOMI. No icterus. Conjunctiva pink. Respiratory: Respirations even and unlabored. Lungs clear to auscultation bilaterally.   No wheezes, crackles, or rhonchi.  Cardiovascular:   Regular rate and rhythm. No peripheral edema, cyanosis or pallor.  Gastrointestinal:  Soft, nondistended, nontender. No rebound or guarding. Normal bowel sounds. No appreciable masses or hepatomegaly. Rectal:  Not performed.  Msk:  Symmetrical without gross deformities. Without edema, no deformity or joint abnormality.  Neurologic:  Alert and  oriented x4;  grossly normal neurologically.  Skin:   Dry and intact without significant lesions or rashes. Psychiatric: Oriented to person, place and time. Demonstrates good judgement and reason without abnormal affect or behaviors.    RELEVANT LABS AND IMAGING: CBC  Component Value Date/Time   WBC 4.7 06/06/2023 1138   RBC 5.39 06/06/2023 1138   HGB 16.0 06/06/2023 1138   HCT 46.7 06/06/2023 1138   PLT 264 06/06/2023 1138   MCV 86.6 06/06/2023 1138   MCH 29.7 06/06/2023 1138   MCHC 34.3 06/06/2023 1138   RDW 12.7 06/06/2023 1138   LYMPHSABS 2.5 01/06/2023 1325   MONOABS 0.8 01/06/2023 1325   EOSABS 0.0 01/06/2023 1325   BASOSABS 0.0 01/06/2023 1325    CMP     Component Value Date/Time   NA 136 06/06/2023 1138   NA 141 04/09/2019 1648   K 4.0 06/06/2023 1138   CL 102 06/06/2023 1138   CO2 26 06/06/2023 1138   GLUCOSE 100 (H) 06/06/2023 1138   BUN 8 06/06/2023 1138   BUN 10 04/09/2019 1648   CREATININE 1.02 06/06/2023 1138   CALCIUM 9.2 06/06/2023 1138   PROT 7.5 10/20/2022 1034   ALBUMIN 4.3 10/20/2022 1034   AST 21 10/20/2022 1034   ALT 37 10/20/2022 1034   ALKPHOS 51 10/20/2022 1034   BILITOT 1.0 10/20/2022 1034   GFRNONAA >60 06/06/2023 1138   GFRAA 135 04/09/2019 1648     Assessment/Plan:   GERD Epigastric pain History of H. pylori infection Longstanding history of GERD without previous EGD.  Questionable H. pylori infection via breath test from PCP (we do not have these records available) s/p antibiotic treatment without eradication study.  Recent flareup and evaluated in ED after eating pizza/tacos/salsa Verde.   Remains on pantoprazole 40 Mg once daily which overall controls his symptoms but sometimes he has breakthrough. BID PPI causes tension headaches.  DDx includes esophagitis, gastritis, PUD, recurrent H. pylori - EGD with biopsies for further evaluation to rule out H. pylori and Barrett's esophagus - I thoroughly discussed the procedure with the patient (at bedside) to include nature of the procedure, alternatives, benefits, and risks (including but not limited to bleeding, infection, perforation, anesthesia/cardiac pulmonary complications).  Patient verbalized understanding and gave verbal consent to proceed with procedure. - Educated patient on lifestyle modifications and provided patient education handouts - Further management based on EGD findings   This visit required 38 minutes of patient care (this includes precharting, chart review, review of results, face-to-face time used for counseling as well as treatment plan and follow-up. The patient was provided an opportunity to ask questions and all were answered. The patient agreed with the plan and demonstrated an understanding of the instructions.    Lara Mulch [facility] 06/07/2023, 10:45 AM  Cc: Sherlie Ban, NP

## 2023-06-07 NOTE — Patient Instructions (Addendum)
_______________________________________________________  If your blood pressure at your visit was 140/90 or greater, please contact your primary care physician to follow up on this.  _______________________________________________________  If you are age 28 or older, your body mass index should be between 23-30. Your Body mass index is 26.78 kg/m. If this is out of the aforementioned range listed, please consider follow up with your Primary Care Provider.  If you are age 45 or younger, your body mass index should be between 19-25. Your Body mass index is 26.78 kg/m. If this is out of the aformentioned range listed, please consider follow up with your Primary Care Provider.   ________________________________________________________  The [facility] providers would like to encourage you to use [facility] to communicate with providers for non-urgent requests or questions.  Due to long hold times on the telephone, sending your provider a message by [facility] may be a faster and more efficient way to get a response.  Please allow 48 business hours for a response.  Please remember that this is for non-urgent requests.  _______________________________________________________   It was a pleasure to see you today!  Thank you for trusting me with your gastrointestinal care!

## 2023-06-08 NOTE — Progress Notes (Signed)
 Noted.

## 2023-06-16 NOTE — Progress Notes (Deleted)
 Follow Up Note  RE: Dave Hernandez MRN: [id number] DOB: 13-Feb-1996 Date of Office Visit: 06/17/2023  Referring provider: Bing Neighbors, NP Primary care provider: Sherlie Ban, NP  Chief Complaint: No chief complaint on file.  History of Present Illness: I had the pleasure of seeing Dave Hernandez for a follow up visit at the [facility] on 06/16/2023. He is a 28 y.o. male, who is being followed for shortness of breath, chest tightness, GERD, chronic rhinitis. His previous allergy office visit was on 04/19/2023 with Dr. Selena Batten. Today is a regular follow up visit.  Discussed the use of AI scribe software for clinical note transcription with the patient, who gave verbal consent to proceed.  History of Present Illness            06/07/2018 5 GI visit: "GERD Epigastric pain History of H. pylori infection Longstanding history of GERD without previous EGD.  Questionable H. pylori infection via breath test from PCP (we do not have these records available) s/p antibiotic treatment without eradication study.  Recent flareup and evaluated in ED after eating pizza/tacos/salsa Verde.  Remains on pantoprazole 40 Mg once daily which overall controls his symptoms but sometimes he has breakthrough. BID PPI causes tension headaches.  DDx includes esophagitis, gastritis, PUD, recurrent H. pylori - EGD with biopsies for further evaluation to rule out H. pylori and Barrett's esophagus - I thoroughly discussed the procedure with the patient (at bedside) to include nature of the procedure, alternatives, benefits, and risks (including but not limited to bleeding, infection, perforation, anesthesia/cardiac pulmonary complications).  Patient verbalized understanding and gave verbal consent to proceed with procedure. - Educated patient on lifestyle modifications and provided patient education handouts - Further management based on EGD findings"  Assessment and Plan: Dave Hernandez is a 28  y.o. male with: Shortness of breath Chest tightness History of asthma Recent onset of intermittent shortness of breath and chest tightness, with no clear triggers. History of childhood asthma, but no inhaler use for several years. Today's spirometry was normal pattern with 2% and 120cc improvement in FEV1 post bronchodilator treatment. Clinically feeling slightly improved. Discussed that his above symptoms may not be all due to his lungs.  We will try a rescue inhaler for a few weeks. May use albuterol rescue inhaler 2 puffs every 4 to 6 hours as needed for shortness of breath, chest tightness, coughing, and wheezing.  Monitor frequency of use - if you need to use it more than twice per week on a consistent basis let us know.    Gastroesophageal reflux disease, unspecified whether esophagitis present Currently on Pantoprazole. H/o H. Pylori? See handout for lifestyle and dietary modifications. Continue pantoprazole 40mg  once day - nothing to eat or drink for 20-30 minutes afterwards.    Chronic rhinitis Had negative skin testing in the past per patient.  Requesting records from your PCP regarding your past allergy testing.   Return in about 4 weeks (around 05/17/2023). Please make sure you follow up with your PCP regarding the elevated liver enzymes.  Assessment and Plan              No follow-ups on file.  No orders of the defined types were placed in this encounter.  Lab Orders  No laboratory test(s) ordered today    Diagnostics: Spirometry:  Tracings reviewed. His effort: {Blank single:19197::"Good reproducible efforts.","It was hard to get consistent efforts and there is a question as to whether this reflects a maximal maneuver.","Poor effort,  data can not be interpreted."} FVC: ***L FEV1: ***L, ***% predicted FEV1/FVC ratio: ***% Interpretation: {Blank single:19197::"Spirometry consistent with mild obstructive disease","Spirometry consistent with moderate obstructive  disease","Spirometry consistent with severe obstructive disease","Spirometry consistent with possible restrictive disease","Spirometry consistent with mixed obstructive and restrictive disease","Spirometry uninterpretable due to technique","Spirometry consistent with normal pattern","No overt abnormalities noted given today's efforts"}.  Please see scanned spirometry results for details.  Skin Testing: {Blank single:19197::"Select foods","Environmental allergy panel","Environmental allergy panel and select foods","Food allergy panel","None","Deferred due to recent antihistamines use"}. *** Results discussed with patient/family.   Medication List:  Current Outpatient Medications  Medication Sig Dispense Refill   acetaminophen (TYLENOL) 500 MG tablet Take 1 tablet (500 mg total) by mouth every 6 (six) hours for 2-3 days then take every 6 hours as needed 30 tablet 0   albuterol (VENTOLIN HFA) 108 (90 Base) MCG/ACT inhaler Inhale 2 puffs into the lungs every 4 (four) hours as needed for wheezing or shortness of breath (coughing fits). 18 g 0   Cholecalciferol (VITAMIN D3) 1.25 MG (50000 UT) CAPS Take by mouth.     dicyclomine (BENTYL) 20 MG tablet Take 20 mg by mouth 3 (three) times daily as needed.     pantoprazole (PROTONIX) 40 MG tablet Take 40 mg by mouth daily.     sertraline (ZOLOFT) 25 MG tablet Take 25 mg by mouth daily.     sucralfate (CARAFATE) 1 g tablet Take 1 tablet (1 g total) by mouth 4 (four) times daily -  with meals and at bedtime. (Patient not taking: Reported on 06/07/2023) 30 tablet 0   No current facility-administered medications for this visit.   Allergies: Allergies  Allergen Reactions   Penicillins Other (See Comments)    Childhood allergy - Patient tolerated Augmentin well in ED without signs of anaphylaxis or any other allergic reaction.    I reviewed his past medical history, social history, family history, and environmental history and no significant changes have  been reported from his previous visit.  Review of Systems  Constitutional:  Negative for appetite change, chills, fever and unexpected weight change.  HENT:  Negative for congestion and rhinorrhea.   Eyes:  Negative for itching.  Respiratory:  Positive for chest tightness and shortness of breath. Negative for cough and wheezing.   Cardiovascular:  Negative for chest pain.  Gastrointestinal:  Positive for abdominal pain.  Genitourinary:  Negative for difficulty urinating.  Skin:  Negative for rash.  Neurological:  Negative for headaches.    Objective: There were no vitals taken for this visit. There is no height or weight on file to calculate BMI. Physical Exam Vitals and nursing note reviewed.  Constitutional:      Appearance: Normal appearance. He is well-developed.  HENT:     Head: Normocephalic and atraumatic.     Right Ear: Tympanic membrane and external ear normal.     Left Ear: Tympanic membrane and external ear normal.     Nose: Nose normal.     Mouth/Throat:     Mouth: Mucous membranes are moist.     Pharynx: Oropharynx is clear.  Eyes:     Conjunctiva/sclera: Conjunctivae normal.  Cardiovascular:     Rate and Rhythm: Normal rate and regular rhythm.     Heart sounds: Normal heart sounds. No murmur heard.    No friction rub. No gallop.  Pulmonary:     Effort: Pulmonary effort is normal.     Breath sounds: Normal breath sounds. No wheezing, rhonchi or rales.  Musculoskeletal:  Cervical back: Neck supple.  Skin:    General: Skin is warm.     Findings: No rash.  Neurological:     Mental Status: He is alert and oriented to person, place, and time.  Psychiatric:        Behavior: Behavior normal.    Previous notes and tests were reviewed. The plan was reviewed with the patient/family, and all questions/concerned were addressed.  It was my pleasure to see Json today and participate in his care. Please feel free to contact me with any questions or  concerns.  Sincerely,  Wyline Mood, DO Allergy & Immunology  [facility] office: [phone] [facility] office: [phone]

## 2023-06-17 ENCOUNTER — Ambulatory Visit: Payer: Commercial Managed Care - HMO | Admitting: Allergy

## 2023-06-23 NOTE — Progress Notes (Deleted)
 Follow Up Note  RE: Dave Hernandez MRN: [id number] DOB: Sep 11, 1995 Date of Office Visit: 06/24/2023  Referring provider: Bing Neighbors, NP Primary care provider: Sherlie Ban, NP  Chief Complaint: No chief complaint on file.  History of Present Illness: I had the pleasure of seeing Dave Hernandez for a follow up visit at the [facility] on 06/23/2023. He is a 28 y.o. male, who is being followed for shortness of breath, chest tightness, GERD, chronic rhinitis. His previous allergy office visit was on 04/19/2023 with Dr. Selena Batten. Today is a regular follow up visit.  Discussed the use of AI scribe software for clinical note transcription with the patient, who gave verbal consent to proceed.  History of Present Illness            06/07/2018 5 GI visit: "GERD Epigastric pain History of H. pylori infection Longstanding history of GERD without previous EGD.  Questionable H. pylori infection via breath test from PCP (we do not have these records available) s/p antibiotic treatment without eradication study.  Recent flareup and evaluated in ED after eating pizza/tacos/salsa Verde.  Remains on pantoprazole 40 Mg once daily which overall controls his symptoms but sometimes he has breakthrough. BID PPI causes tension headaches.  DDx includes esophagitis, gastritis, PUD, recurrent H. pylori - EGD with biopsies for further evaluation to rule out H. pylori and Barrett's esophagus - I thoroughly discussed the procedure with the patient (at bedside) to include nature of the procedure, alternatives, benefits, and risks (including but not limited to bleeding, infection, perforation, anesthesia/cardiac pulmonary complications).  Patient verbalized understanding and gave verbal consent to proceed with procedure. - Educated patient on lifestyle modifications and provided patient education handouts - Further management based on EGD findings"  Assessment and Plan: Can is a 28  y.o. male with: Shortness of breath Chest tightness History of asthma Recent onset of intermittent shortness of breath and chest tightness, with no clear triggers. History of childhood asthma, but no inhaler use for several years. Today's spirometry was normal pattern with 2% and 120cc improvement in FEV1 post bronchodilator treatment. Clinically feeling slightly improved. Discussed that his above symptoms may not be all due to his lungs.  We will try a rescue inhaler for a few weeks. May use albuterol rescue inhaler 2 puffs every 4 to 6 hours as needed for shortness of breath, chest tightness, coughing, and wheezing.  Monitor frequency of use - if you need to use it more than twice per week on a consistent basis let us know.    Gastroesophageal reflux disease, unspecified whether esophagitis present Currently on Pantoprazole. H/o H. Pylori? See handout for lifestyle and dietary modifications. Continue pantoprazole 40mg  once day - nothing to eat or drink for 20-30 minutes afterwards.    Chronic rhinitis Had negative skin testing in the past per patient.  Requesting records from your PCP regarding your past allergy testing.   Return in about 4 weeks (around 05/17/2023). Please make sure you follow up with your PCP regarding the elevated liver enzymes.  Assessment and Plan              No follow-ups on file.  No orders of the defined types were placed in this encounter.  Lab Orders  No laboratory test(s) ordered today    Diagnostics: Spirometry:  Tracings reviewed. His effort: {Blank single:19197::"Good reproducible efforts.","It was hard to get consistent efforts and there is a question as to whether this reflects a maximal maneuver.","Poor effort,  data can not be interpreted."} FVC: ***L FEV1: ***L, ***% predicted FEV1/FVC ratio: ***% Interpretation: {Blank single:19197::"Spirometry consistent with mild obstructive disease","Spirometry consistent with moderate obstructive  disease","Spirometry consistent with severe obstructive disease","Spirometry consistent with possible restrictive disease","Spirometry consistent with mixed obstructive and restrictive disease","Spirometry uninterpretable due to technique","Spirometry consistent with normal pattern","No overt abnormalities noted given today's efforts"}.  Please see scanned spirometry results for details.  Skin Testing: {Blank single:19197::"Select foods","Environmental allergy panel","Environmental allergy panel and select foods","Food allergy panel","None","Deferred due to recent antihistamines use"}. *** Results discussed with patient/family.   Medication List:  Current Outpatient Medications  Medication Sig Dispense Refill   acetaminophen (TYLENOL) 500 MG tablet Take 1 tablet (500 mg total) by mouth every 6 (six) hours for 2-3 days then take every 6 hours as needed 30 tablet 0   albuterol (VENTOLIN HFA) 108 (90 Base) MCG/ACT inhaler Inhale 2 puffs into the lungs every 4 (four) hours as needed for wheezing or shortness of breath (coughing fits). 18 g 0   Cholecalciferol (VITAMIN D3) 1.25 MG (50000 UT) CAPS Take by mouth.     dicyclomine (BENTYL) 20 MG tablet Take 20 mg by mouth 3 (three) times daily as needed.     pantoprazole (PROTONIX) 40 MG tablet Take 40 mg by mouth daily.     sertraline (ZOLOFT) 25 MG tablet Take 25 mg by mouth daily.     sucralfate (CARAFATE) 1 g tablet Take 1 tablet (1 g total) by mouth 4 (four) times daily -  with meals and at bedtime. (Patient not taking: Reported on 06/07/2023) 30 tablet 0   No current facility-administered medications for this visit.   Allergies: Allergies  Allergen Reactions   Penicillins Other (See Comments)    Childhood allergy - Patient tolerated Augmentin well in ED without signs of anaphylaxis or any other allergic reaction.    I reviewed his past medical history, social history, family history, and environmental history and no significant changes have  been reported from his previous visit.  Review of Systems  Constitutional:  Negative for appetite change, chills, fever and unexpected weight change.  HENT:  Negative for congestion and rhinorrhea.   Eyes:  Negative for itching.  Respiratory:  Positive for chest tightness and shortness of breath. Negative for cough and wheezing.   Cardiovascular:  Negative for chest pain.  Gastrointestinal:  Positive for abdominal pain.  Genitourinary:  Negative for difficulty urinating.  Skin:  Negative for rash.  Neurological:  Negative for headaches.    Objective: There were no vitals taken for this visit. There is no height or weight on file to calculate BMI. Physical Exam Vitals and nursing note reviewed.  Constitutional:      Appearance: Normal appearance. He is well-developed.  HENT:     Head: Normocephalic and atraumatic.     Right Ear: Tympanic membrane and external ear normal.     Left Ear: Tympanic membrane and external ear normal.     Nose: Nose normal.     Mouth/Throat:     Mouth: Mucous membranes are moist.     Pharynx: Oropharynx is clear.  Eyes:     Conjunctiva/sclera: Conjunctivae normal.  Cardiovascular:     Rate and Rhythm: Normal rate and regular rhythm.     Heart sounds: Normal heart sounds. No murmur heard.    No friction rub. No gallop.  Pulmonary:     Effort: Pulmonary effort is normal.     Breath sounds: Normal breath sounds. No wheezing, rhonchi or rales.  Musculoskeletal:  Cervical back: Neck supple.  Skin:    General: Skin is warm.     Findings: No rash.  Neurological:     Mental Status: He is alert and oriented to person, place, and time.  Psychiatric:        Behavior: Behavior normal.    Previous notes and tests were reviewed. The plan was reviewed with the patient/family, and all questions/concerned were addressed.  It was my pleasure to see Shivam today and participate in his care. Please feel free to contact me with any questions or  concerns.  Sincerely,  Wyline Mood, DO Allergy & Immunology  [facility] office: [phone] [facility] office: [phone]

## 2023-06-24 ENCOUNTER — Ambulatory Visit: Admitting: Allergy

## 2023-06-28 ENCOUNTER — Telehealth: Payer: Self-pay | Admitting: Internal Medicine

## 2023-06-28 NOTE — Telephone Encounter (Signed)
 I received an email from [employer] they denied his EGD--see notes below.  This decision was based on the following:  Your doctor told us that you have heartburn. A test to see images from  your throat to your stomach was asked for. We cannot approve this request  because:  An EGD can be done for one of these reasons.   Your symptoms failed to improve or they returned after a trial of  treatment directed by your doctor. This treatment must have included an  eight-week trial of a proton pump inhibitor (PPI) taken once a day or a  four week trial of a PPI taken twice a day. A PPI is a drug that helps to  reduce stomach acid.   You have trouble swallowing.   You have chest pain when swallowing.   You have lost more than five percent of your weight in the past six to  12 months without trying.   Your doctor suspects bleeding in your upper gastrointestinal (GI) tract  based on blood tests, exam and/or history. The upper GI tract is the upper  portion of the path food travels through your body.   You have a problem that occurs when your blood lacks enough healthy red  blood cells containing iron. Your doctor suspects this problem started in  your upper GI tract.   You have been vomiting for at least seven days.   You are throwing up blood.   Imaging of your upper GI shows an abnormal mass, narrowing, or ulcer.  The notes sent to [place] do not show any of these reasons for testing.    Your doctor, hospital or health care professional:  Call [employer] at [phone] for peer-to-peer.  Customer ID #: [id number]  Reference Case#: [id number]   Thank you. I have faxed over all notes, imaging, labs already but they still denied it.

## 2023-07-01 NOTE — Telephone Encounter (Signed)
 Garr Kalata, PA-C sent to Heather Litter, RN Approved. [id number]. Approved. Expires 12/28/2023       April, please see approval above

## 2023-07-03 ENCOUNTER — Encounter (INDEPENDENT_AMBULATORY_CARE_PROVIDER_SITE_OTHER): Payer: Self-pay | Admitting: Otolaryngology

## 2023-07-03 ENCOUNTER — Ambulatory Visit (INDEPENDENT_AMBULATORY_CARE_PROVIDER_SITE_OTHER)

## 2023-07-03 ENCOUNTER — Ambulatory Visit (INDEPENDENT_AMBULATORY_CARE_PROVIDER_SITE_OTHER): Admitting: Otolaryngology

## 2023-07-03 VITALS — BP 144/77 | HR 62

## 2023-07-03 DIAGNOSIS — K219 Gastro-esophageal reflux disease without esophagitis: Secondary | ICD-10-CM

## 2023-07-03 DIAGNOSIS — R09A2 Foreign body sensation, throat: Secondary | ICD-10-CM | POA: Diagnosis not present

## 2023-07-03 DIAGNOSIS — J3089 Other allergic rhinitis: Secondary | ICD-10-CM

## 2023-07-03 DIAGNOSIS — R0981 Nasal congestion: Secondary | ICD-10-CM

## 2023-07-03 DIAGNOSIS — J343 Hypertrophy of nasal turbinates: Secondary | ICD-10-CM

## 2023-07-03 DIAGNOSIS — H6980 Other specified disorders of Eustachian tube, unspecified ear: Secondary | ICD-10-CM

## 2023-07-03 DIAGNOSIS — R0982 Postnasal drip: Secondary | ICD-10-CM

## 2023-07-03 MED ORDER — AZELASTINE HCL 0.1 % NA SOLN: 2.0000 | Freq: Two times a day (BID) | NASAL | 12 refills | Status: DC

## 2023-07-03 NOTE — Progress Notes (Signed)
 ENT Progress Note:   Update 07/03/2023  Discussed the use of AI scribe software for clinical note transcription with the patient, who gave verbal consent to proceed.  History of Present Illness Dave Hernandez is a 28 year old male who presents with sensation of swollen tongue and lymph node along the left side.   For the past two weeks, he has experienced swollen lymph node and a sensation of swelling in the back of his tongue, particularly noticeable when swallowing. He describes the sensation as 'weird' and feels something there when swallowing. He denies chills, fevers, or increased postnasal drainage but notes more nasal congestion and facial puffiness. His voice has been hoarse, and he has experienced a full feeling in his ears.  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 Recently, he has noted increased symptoms with high pollen levels, including lymph node discomfort. He was using a nasal spray but discontinued it due to concerns about tongue swelling. He switched to an allergy medication similar to Allegra, but it was ineffective. He is currently taking Levocetirizine daily for allergies.  He has a history of gastroesophageal reflux disease (GERD) and is on Protonix (pantoprazole). He recounts a recent episode of chest discomfort after consuming spicy foods, which aggravated his esophagus. He has been using Mylanta for relief but has been cautious with his diet since the incident.  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  Records Reviewed:  ED visit 06/06/23  Dave Hernandez is a 28 y.o. male with a past medical history significant for asthma, kidney stones, depression, anxiety, and previously elevated troponin who presents with chest discomfort.  Patient reports that several days  ago he had pizza followed by a spicy taco with a green hot sauce on it that was extremely spicy.  He reports that he ate it and then about 20 months later started having this burning chest discomfort.  It has bothered him for the last few days and is especially worse at night when he lays flat.  He feels it is a burning coming from his epigastric area up towards his chest.  He reports no real shortness of breath at this time although he does say over the last few months he has had some intermittent exertional shortness of breath.  Denies any leg pain or leg swelling.  Reports no sharp discomfort.  Denies any nausea, vomiting, or constipation but had to have burning diarrhea that was explosive after the taco.  He was concerned about possible food poisoning as well.  Otherwise due to his history of elevated troponin and possible heart injury in the past, he went to make sure it was not his heart.   Denies history of blood clots or any trauma.   On exam, lungs clear.  Chest nontender.  Abdomen nontender.  No murmur.  Patient extremely well-appearing and vital signs reassuring.   EKG does not show STEMI and appeared similar to prior.    Patient had troponin x 2 that were negative.  X-ray reassuring.  The patient was given a GI cocktail as a suspect this is a gastritis or esophagitis after the spicy food and is already sensitive GI tract.  He is unsure what feels similar to when he had his ulcer or not but he denies any blood in his emesis or stool.  After  the GI cocktail the pain completely resolved.  He is feeling much better.  He otherwise had reassuring workup.   Given his well appearance and vitals we agreed we have a low suspicion for a thromboembolic etiology as he is PERC negative.  Will have him follow-up with outpatient cardiology given the history of heart injury and this shortness of breath he has been having although I do not suspect a cardiac cause of his symptoms today.  I suspect is related to  the spicy food causing this burning discomfort.  He will also follow-up with his GI doctor.  He is already on acid medication but will give Carafate as that seem to help today as well.  He will rest and stay hydrated and have a bland diet.  He understood return precautions and follow-up instructions and was discharged in good condition.  Initial Evaluation  Update 04/19/23  Discussed the use of AI scribe software for clinical note transcription with the patient, who gave verbal consent to proceed.  History of Present Illness   The patient presents with concern regarding tongue swelling.  Tongue swelling was initially noted on a CT scan performed at the end of December, 2024. Two CT scans were conducted, one before and one after surgery, with the latter at [facility]. They experience a sensation of swelling without pain in the tongue area.  The onset of tongue swelling symptoms is associated with the use of allergy medication and nasal spray, which were started a week prior to noticing the swelling. After discontinuing these medications, head pain and pressure symptoms resolved.     Records reviewed  Update 02/22/23 S/p tonsillectomy for chronic tonsillitis and previously noted right tonsillar cyst, here for post-op f/u. Reports his throat pain is better now and he is eating regular but soft diet currently. No bleeding episodes. He had some ear discomfort with pain with talking initially, but it got better. Has mild right temple and frontal headache. Has had some pain along the right face. He has hx of bruxism.   Update 01/21/23: Returns for follow-up.  Finished antibiotics.  He had another visit to the ER 01/12/2023 due to recurrent throat discomfort and sensation of drainage from the lower right tonsillar cyst.  He also had recurrent swelling along the right face and neck pain on the right side.  He admits to doing physical work prior to onset of neck discomfort. He was started on Naproxen. No  dental pain or infected teeth.  While in ED he had overall unremarkable CT neck with contrast which did not demonstrate peritonsillar abscess but did show previously seen right tonsillar cyst.  \ ED visit note 01/12/2023 Patient has a history of depression and anxiety kidney stones pneumonia and a recent diagnosis of a tonsillar abscess. Patient was seen in the emergency room on October 20. He was complaining of sore throat and had a CT scan of his neck. The CT scan showed a small less than 1 cm fluid collection along the lateral oropharyngeal wall at the inferior aspect of the right palate teen tonsil. Patient followed up with ENT Dr. Larkin Plumb on October 21. Plan was to continue Augmentin and steroids. No indication for incision and drainage patient was instructed if any worsening symptoms to return to the ED. Patient states this morning he felt like he felt the bump on the side of his neck. He also felt like there may have been some swelling on the right side of his face and he was having  some headache and discomfort on the right side of his head. He has not had any fevers or chills. He is not having any difficulty swallowing.   Initial evaluation by me Reason for Consult: Right tonsillar collection with drainage  HPI: Dave Hernandez is an 29 y.o. male with hx of long-standing sore throat and a cyst, that appears to drain on the right side of his throat. At times he is able to taste the drainage, when it drains on its own. He reports that 1-2 months ago he developed left ear pressure, went to ED and at first was diagnosed with an ear infection. It started to clear up. He then developed an abscess right lower mandibular area, cleared with Clindamycin and it was thought to be dental in etiology. Then his left ear pain returned. Two weeks ago he developed sore throat again, would get better with Aleve. His sore throat became more persistent later on, and when he looked at his throat himself he noticed the  "abscess" on the right side of his throat. He was given Z-pack. The next day he went to urgent care 2/2 persistent sx, and was told to take Clindamycin. He was seen in ED yesterday and was started on prednisone and Augmentin.  He denies fever, he denies trouble with swallowing. He feels his voice is hoarse. Denies poor dentiition.       Past Medical History:  Diagnosis Date   Angio-edema    Anxiety    Asthma    as a child, no inhaler   COVID-19    Depression    GERD (gastroesophageal reflux disease)    Headache    History of kidney stones    passed stones, no surgery   Pneumonia    x 1   PONV (postoperative nausea and vomiting)    Seasonal allergies     Past Surgical History:  Procedure Laterality Date   TONSILLECTOMY Bilateral 02/01/2023   Procedure: TONSILLECTOMY;  Surgeon: Artice Last, MD;  Location: [facility] OR;  Service: ENT;  Laterality: Bilateral;   TONSILLECTOMY     WISDOM TOOTH EXTRACTION      Family History  Problem Relation Age of Onset   Heart attack Mother    Angioedema Father    Diabetes Father    Prostate cancer Paternal Uncle    Diabetes Paternal Uncle    Heart failure Maternal Grandfather    Breast cancer Paternal Grandmother    Diabetes Paternal Grandfather     Social History:  reports that he quit smoking about 7 years ago. His smoking use included cigarettes. He has never used smokeless tobacco. He reports that he does not currently use alcohol. He reports current drug use. Drug: Marijuana.  Allergies:  Allergies  Allergen Reactions   Penicillins Other (See Comments)    Childhood allergy - Patient tolerated Augmentin well in ED without signs of anaphylaxis or any other allergic reaction.     Medications: I have reviewed the patient's current medications.  The PMH, PSH, Medications, Allergies, and SH were reviewed and updated.  ROS: Constitutional: Negative for fever, weight loss and weight gain. Cardiovascular: Negative for chest pain and  dyspnea on exertion. Respiratory: Is not experiencing shortness of breath at rest. Gastrointestinal: Negative for nausea and vomiting. Neurological: Negative for headaches. Psychiatric: The patient is not nervous/anxious  Blood pressure (!) 144/77, pulse 62, SpO2 98%.  PHYSICAL EXAM:  Exam: General: Well-developed, well-nourished Respiratory Respiratory effort: Equal inspiration and expiration without stridor Cardiovascular Peripheral Vascular: Warm extremities with  equal color/perfusion Eyes: No nystagmus with equal extraocular motion bilaterally Neuro/Psych/Balance: Patient oriented to person, place, and time; Appropriate mood and affect; Gait is intact with no imbalance; Cranial nerves I-XII are intact Head and Face Inspection: Normocephalic and atraumatic without mass or lesion Facial Strength: Facial motility symmetric and full bilaterally ENT Pinna: External ear intact and fully developed External canal: Canal is patent with intact skin Tympanic Membrane: Clear and mobile External Nose: No scar or anatomic deformity Internal Nose: Septum is deviated to the left on anterior rhinoscopy Bilateral inferior turbinate hypertrophy.  Lips, Teeth, and gums: Mucosa and teeth intact and viable TMJ: Tender to palpation R side with full mobility Oral cavity/oropharynx: No exudate, no lesions present. No tongue or floor of mouth swelling. S/p tonsillectomy, healed completely following tonsillectomy, no base of the tongue edema, no tongue edema  Larynx and hypopharynx:  B/l VF are mobile, no lesions, moderate post-cricoid edema noted Nasopharynx: post-nasal drainage and cobblestoning of posterior pharyngeal wall No significant adenoid hypertrophy Neck Neck and Trachea: Midline trachea without mass or lesion No palpable lymphadenopathy   Procedures:   Preoperative diagnosis: globus sensation, sensation of tongue fullness  Postoperative diagnosis:   Same + GERD LPR  Procedure:  Flexible fiberoptic laryngoscopy   Surgeon: Artice Last, MD  Anesthesia: Topical lidocaine and Afrin Complications: None Condition is stable throughout exam  Indications and consent:  The patient presents to the clinic with Indirect laryngoscopy view was incomplete. Thus it was recommended that they undergo a flexible fiberoptic laryngoscopy. All of the risks, benefits, and potential complications were reviewed with the patient preoperatively and verbal informed consent was obtained.  Procedure: The patient was seated upright in the clinic. Topical lidocaine and Afrin were applied to the nasal cavity. After adequate anesthesia had occurred, I then proceeded to pass the flexible telescope into the nasal cavity. The nasal cavity was patent without rhinorrhea or polyp. The nasopharynx was also patent without mass or lesion. The base of tongue was visualized and was normal. There were no signs of pooling of secretions in the piriform sinuses. The true vocal folds were mobile bilaterally. There were no signs of glottic or supraglottic mucosal lesion or mass. There was moderate interarytenoid pachydermia and post cricoid edema. The telescope was then slowly withdrawn and the patient tolerated the procedure throughout.  PROCEDURE NOTE: nasal endoscopy  Preoperative diagnosis: chronic nasal congestion symptoms  Postoperative diagnosis: same  Procedure: Diagnostic nasal endoscopy (16109)  Surgeon: Artice Last, M.D.  Anesthesia: Topical lidocaine and Afrin  H&P REVIEW: The patient's history and physical were reviewed today prior to procedure. All medications were reviewed and updated as well. Complications: None Condition is stable throughout exam Indications and consent: The patient presents with symptoms of chronic sinusitis not responding to previous therapies. All the risks, benefits, and potential complications were reviewed with the patient preoperatively and informed consent was  obtained. The time out was completed with confirmation of the correct procedure.   Procedure: The patient was seated upright in the clinic. Topical lidocaine and Afrin were applied to the nasal cavity. After adequate anesthesia had occurred, the rigid nasal endoscope was passed into the nasal cavity. The nasal mucosa, turbinates, septum, and sinus drainage pathways were visualized bilaterally. This revealed no purulence or significant secretions that might be cultured. There were no polyps or sites of significant inflammation. The mucosa was intact and there was no crusting present. The scope was then slowly withdrawn and the patient tolerated the procedure well. There were no complications  or blood loss.  Studies Reviewed: CT neck with contrast 01/06/23  FINDINGS: Pharynx and larynx: Small (9 x 7 x 5 mm) peripherally enhancing fluid collection along the superficial/mucosal surface of the right lateral oropharyngeal wall at the inferior aspect of the right palatine tonsil (see series 3, image 62 and series 6, image 32).   Salivary glands: No inflammation, mass, or stone.   Thyroid: Normal.   Lymph nodes: None enlarged or abnormal density.   Vascular: Not well evaluated on this study due to non arterial contrast timing.   Limited intracranial: Negative.   Visualized orbits: Negative.   Mastoids and visualized paranasal sinuses: Clear.   Skeleton: No acute or aggressive process.   Upper chest: Visualized lung apices are clear.   IMPRESSION: Small (9 x 7 x 5 mm) peripherally enhancing fluid collection along the superficial/mucosal surface of the right lateral oropharyngeal wall at the inferior aspect of the right palatine tonsil. This could represent a small abscess or cyst (potentially infected). While thought less likely, recommend correlation with direct inspection to exclude malignancy.  01/12/23 IMPRESSION: 1. Unchanged mild asymmetry of the right lower palatine  tonsil, which is more likely a benign retention cyst rather than sequelae of tonsillar infection given absence of parapharyngeal inflammation, lymphadenopathy, or other acute findings. 2. Otherwise stable and negative CT appearance of the Neck  CT neck 03/14/23 CT MAXILLOFACIAL FINDINGS   Osseous: No fracture or mandibular dislocation. No destructive process.   Sinuses/Orbits: Paranasal sinuses and mastoid air cells are clear. The orbits are unremarkable.   Soft tissues: Negative.   CT CERVICAL SPINE FINDINGS   Alignment: Normal.   Skull base and vertebrae: No acute fracture. No aggressive appearing focal osseous lesion or focal pathologic process.   Soft tissues and spinal canal: No prevertebral fluid or swelling. No visible canal hematoma.   Upper chest: Unremarkable.   Other: Question slight asymmetry and possible induration of the base of the tongue (4:63).   IMPRESSION: 1.  No acute intracranial abnormality. 2.  No acute displaced facial fracture. 3. No acute displaced fracture or traumatic listhesis of the cervical spine. 4. Question slight asymmetry and possible induration of the base of the tongue. Recommend correlation with direct visualization. 5. Previously visualized tonsillar cyst not well visualized with limited evaluation on this noncontrast study.  Assessment/Plan: Encounter Diagnoses  Name Primary?   Chronic GERD Yes   Chronic nasal congestion    Environmental and seasonal allergies    Post-nasal drip    Hypertrophy of both inferior nasal turbinates    Globus sensation        Chronic sore throat pain with swallowing concern for tonsillar abscess.  I reviewed CT neck imaging and it appears to have well circumcised mildly enhancing lesion next to the inferior area of the right tonsil which is consistent with right tonsillar cyst seen on flexible laryngoscopy and oropharyngeal exam today  2.  Chronic tonsillitis history of tonsillar  hypertrophy -I suspect most of his sore throat and throat pain symptoms are due to chronic tonsillitis and we discussed tonsillectomy -Risks and benefits of tonsillectomy were discussed today he will consider in the future if symptoms persist -He will finish Augmentin and steroid course given to him in ED yesterday -We discussed how management of postnasal drainage and GERD LPR can improve sore throat symptoms -Gargle with salt water  3.  GERD LPR -Continue Pepcid 20 mg daily -Diet and lifestyle changes to minimize reflux  4.  Nasal congestion suspect environmental allergies -  Continue daily antihistamine and Flonase 2 puffs twice daily bilateral nares  -He will return in a few weeks for symptom check and to discuss tonsillectomy if indicated at the time  Update 01/21/23 Had another visit to ED for neck pain, persistent throat discomfort. Repeat CT neck with no abscess, evidence of previously seen right tonsillar cyst. Otherwise unremarkable.   Chronic sore throat - improved after abx, but still has sensation of tonsillar cyst draining mucus  2.  Chronic tonsillitis history of tonsillar hypertrophy -I suspect most of his sore throat and throat pain symptoms are due to chronic tonsillitis and we discussed tonsillectomy -Risks and benefits of tonsillectomy were discussed today and he would like to proceed - will book for tonsillectomy  3.  GERD LPR -Continue Pepcid 20 mg daily -Diet and lifestyle changes to minimize reflux - trial of reflux gourmet   4.  Nasal congestion suspect environmental allergies -Continue daily antihistamine and Flonase 2 puffs twice daily bilateral nares  5. Neck pain along right SCM area (superior aspect) no palpable masses on exam - likely musculoskeletal  - continue Naproxen as needed and consider massage therapy   6. Recurrent R facial swelling/pain - likely TMJ related - no salivary gland swelling or mass on exam today and good dentition  - we discussed  preventative care and supportive care when pain swelling recurs  - no pathology in the area on CT neck x 2  Update 02/22/23 3 weeks post-tonsillectomy, doing well, had right temple and facial pain, which I think is likely related to TMJ sy. He was tender on exam, without trismus. Oropharynx appears completely healed at this point. No bleeding, he is eating regular diet.   Chronic tonsillitis and sore throat/tonsillar cyst  - s/p tonsillectomy no issues post-op   GERD LPR -Continue Pepcid 20 mg daily -Diet and lifestyle changes to minimize reflux - trial of reflux gourmet   Nasal congestion suspect environmental allergies -Continue daily antihistamine and Flonase 2 puffs twice daily bilateral nares  Neck pain along right SCM area (superior aspect) no palpable masses on exam - likely musculoskeletal - currently better - continue Naproxen as needed and consider massage therapy   Recurrent R facial swelling/pain - likely TMJ related - no salivary gland swelling or mass on exam today and good dentition but tender at the R TMJ - we discussed preventative care and supportive care  - had negative CT x 2 which we reviewed before   Update 04/19/23 Assessment and Plan    Concern regarding findings of CT neck - "Question slight asymmetry and possible induration of the base of the tongue. Recommend correlation with direct visualization."  No current pain, but some perceived fullness in the area, Flexible scope exam and oropharyngeal exam were normal today. Previous findings were nonspecific.  - patient reassured  - RTC as needed     Update 07/03/2023 Assessment and Plan Assessment & Plan Sensation of tongue swelling globus sensation  Exam including flexible laryngoscopy and bilateral nasal endoscopy without pus or purulence, no lesions, and no edema of the tongue, tongue base or upper airway.  Likely related to post-nasal drainage, GERD LPR or both.  - exam is reassuring, discussed exam  findings and medical management of GERD LPR and post-nasal drainage with the patient   Chronic nasal congestion and post-nasal drainage and sensation of tongue swelling Nasal congestion, postnasal drainage, and ear fullness likely due environmental triggers exacerbated by pollen/dust while at work. Sensation of tongue swelling likely from postnasal  drainage vs GERD LPR - Continue Xyzal 5 mg daily - Prescribed Astelin nasal spray to use BID. - Encourage saline nasal rinses post-outdoor activities. - Advised follow-up with allergy specialists for retesting and potential immunotherapy if candidate - consider nasal saline rinses   Eustachian tube dysfunction Ear fullness due to eustachian tube dysfunction secondary to allergic rhinitis. - Management of chronic nasal congestion as above   Gastroesophageal reflux disease (GERD) GERD exacerbation due to dietary indiscretions causing esophageal irritation. Emphasized reflux control to prevent throat irritation. - Continue pantoprazole. - Recommend Reflux Gourmet after meals - Advise dietary modifications to avoid reflux triggers.   Artice Last, MD Otolaryngology [facility] Phone: [phone] Fax: [phone]    07/03/2023, 3:46 PM

## 2023-07-03 NOTE — Patient Instructions (Addendum)

## 2023-07-04 ENCOUNTER — Encounter: Admitting: Internal Medicine

## 2023-07-16 ENCOUNTER — Encounter: Admitting: Internal Medicine

## 2023-07-17 ENCOUNTER — Ambulatory Visit: Admitting: Gastroenterology

## 2023-07-31 ENCOUNTER — Other Ambulatory Visit: Payer: Self-pay

## 2023-07-31 ENCOUNTER — Ambulatory Visit: Admitting: Allergy

## 2023-07-31 ENCOUNTER — Encounter: Payer: Self-pay | Admitting: Allergy

## 2023-07-31 VITALS — BP 130/84 | HR 69 | Temp 99.2°F | Resp 18 | Ht 74.0 in | Wt 209.9 lb

## 2023-07-31 DIAGNOSIS — K219 Gastro-esophageal reflux disease without esophagitis: Secondary | ICD-10-CM

## 2023-07-31 DIAGNOSIS — J310 Chronic rhinitis: Secondary | ICD-10-CM

## 2023-07-31 DIAGNOSIS — J4520 Mild intermittent asthma, uncomplicated: Secondary | ICD-10-CM | POA: Diagnosis not present

## 2023-07-31 NOTE — Progress Notes (Signed)
 Follow Up Note  RE: Dave Hernandez MRN: [id number] DOB: [birth year] Date of Office Visit: 07/31/2023  Referring provider: Buena Carmine, NP Primary care provider: Granville Layer, NP  Chief Complaint: Allergic Rhinitis  and Asthma  History of Present Illness: I had the pleasure of seeing Dave Hernandez for a follow up visit at the [facility] on 07/31/2023. He is a 28 y.o. male, who is being followed for shortness of breath/chest tightness, GERD, chronic rhinitis. His previous allergy office visit was on 04/19/2023 with Dr. Burdette Carolin. Today is a regular follow up visit. He is accompanied today by his girlfriend who provided/contributed to the history.   Discussed the use of AI scribe software for clinical note transcription with the patient, who gave verbal consent to proceed.    His breathing improved until the recent pollen season, during which he used his albuterol  inhaler two to three times a week for about two to three weeks. He used the inhaler once a day as needed, which alleviated his symptoms. He has not needed to use the inhaler in the past couple of weeks. No current symptoms of asthma. He has a past history of smoking, which he quit after a severe episode of dyspnea.  He currently takes an allergy pill daily, though he cannot recall the name, and notes that it seems to help, especially since he spends a lot of time outdoors. He experienced head congestion during the pollen season, which was relieved by Sudafed. No current symptoms of runny nose, drainage, or congestion. He underwent allergy skin testing last summer, which was negative for common allergens like cats and dogs - requesting results.   He continues to take reflux medication and needs EGD due to a past history of H. pylori infection. ENT work up unremarkable.   No fevers, chills, or current respiratory symptoms.     Assessment and Plan: Dave Hernandez is a 28 y.o. male with: Mild intermittent reactive  airway disease Past history - Recent onset of intermittent shortness of breath and chest tightness, with no clear triggers. History of childhood asthma, but no inhaler use for several years. 2025 spirometry was normal pattern with 2% and 120cc improvement in FEV1 post bronchodilator treatment. Clinically feeling slightly improved. Interim history - used albuterol  for a few weeks with good benefit. Today's spirometry was normal. May use albuterol  rescue inhaler 2 puffs every 4 to 6 hours as needed for shortness of breath, chest tightness, coughing, and wheezing.  Monitor frequency of use - if you need to use it more than twice per week on a consistent basis let us  know.    Chronic rhinitis Past history - Had negative skin testing in the past per patient.  Interim history - increased symptoms in the spring. Requested records never arrived. Saw ENT as well. Requesting records again. Monitor symptoms. Use over the counter antihistamines such as Zyrtec  (cetirizine ), Claritin (loratadine), Allegra (fexofenadine), or Xyzal (levocetirizine) daily as needed. May take twice a day during allergy flares. May switch antihistamines every few months. If worsening symptoms then will recommend to retest.   Gastroesophageal reflux disease, unspecified whether esophagitis present Past history - on Pantoprazole . H/o H. Pylori? Interim history - saw GI and will need EGD. Continue lifestyle and dietary modifications. Continue pantoprazole  40mg  once day - nothing to eat or drink for 20-30 minutes afterwards.  Follow up with GI as scheduled.   Return in about 6 months (around 01/31/2024).  No orders of the defined types were  placed in this encounter.  Lab Orders  No laboratory test(s) ordered today    Diagnostics: Spirometry:  Tracings reviewed. His effort: Good reproducible efforts. FVC: 6.04L FEV1: 4.74L, 98% predicted FEV1/FVC ratio: 78% Interpretation: Spirometry consistent with normal pattern.   Please see scanned spirometry results for details.  Results discussed with patient/family.   Medication List:  Current Outpatient Medications  Medication Sig Dispense Refill   acetaminophen  (TYLENOL ) 500 MG tablet Take 1 tablet (500 mg total) by mouth every 6 (six) hours for 2-3 days then take every 6 hours as needed 30 tablet 0   albuterol  (VENTOLIN  HFA) 108 (90 Base) MCG/ACT inhaler Inhale 2 puffs into the lungs every 4 (four) hours as needed for wheezing or shortness of breath (coughing fits). 18 g 0   azelastine  (ASTELIN ) 0.1 % nasal spray Place 2 sprays into both nostrils 2 (two) times daily. Use in each nostril as directed 30 mL 12   Cholecalciferol (VITAMIN D3) 1.25 MG (50000 UT) CAPS Take by mouth.     levocetirizine (XYZAL) 5 MG tablet Take 5 mg by mouth at bedtime.     pantoprazole  (PROTONIX ) 40 MG tablet Take 40 mg by mouth daily.     No current facility-administered medications for this visit.   Allergies: Allergies  Allergen Reactions   Penicillins Other (See Comments)    Childhood allergy - Patient tolerated Augmentin  well in ED without signs of anaphylaxis or any other allergic reaction.    I reviewed his past medical history, social history, family history, and environmental history and no significant changes have been reported from his previous visit.  Review of Systems  Constitutional:  Negative for appetite change, chills, fever and unexpected weight change.  HENT:  Negative for congestion and rhinorrhea.   Eyes:  Negative for itching.  Respiratory:  Negative for cough, chest tightness, shortness of breath and wheezing.   Cardiovascular:  Negative for chest pain.  Gastrointestinal:  Negative for abdominal pain.  Genitourinary:  Negative for difficulty urinating.  Skin:  Negative for rash.  Neurological:  Negative for headaches.    Objective: BP 130/84 (BP Location: Right Arm, Patient Position: Sitting, Cuff Size: Normal)   Pulse 69   Temp 99.2 F (37.3 C)  (Temporal)   Resp 18   Ht 6\' 2"  (1.88 m)   Wt 209 lb 14.4 oz (95.2 kg)   SpO2 97%   BMI 26.95 kg/m  Body mass index is 26.95 kg/m. Physical Exam Vitals and nursing note reviewed.  Constitutional:      Appearance: Normal appearance. He is well-developed.  HENT:     Head: Normocephalic and atraumatic.     Right Ear: Tympanic membrane and external ear normal.     Left Ear: Tympanic membrane and external ear normal.     Nose: Nose normal.     Mouth/Throat:     Mouth: Mucous membranes are moist.     Pharynx: Oropharynx is clear.  Eyes:     Conjunctiva/sclera: Conjunctivae normal.  Cardiovascular:     Rate and Rhythm: Normal rate and regular rhythm.     Heart sounds: Normal heart sounds. No murmur heard.    No friction rub. No gallop.  Pulmonary:     Effort: Pulmonary effort is normal.     Breath sounds: Normal breath sounds. No wheezing, rhonchi or rales.  Musculoskeletal:     Cervical back: Neck supple.  Skin:    General: Skin is warm.     Findings: No rash.  Neurological:  Mental Status: He is alert and oriented to person, place, and time.  Psychiatric:        Behavior: Behavior normal.    Previous notes and tests were reviewed. The plan was reviewed with the patient/family, and all questions/concerned were addressed.  It was my pleasure to see Dave Hernandez today and participate in his care. Please feel free to contact me with any questions or concerns.  Sincerely,  Eudelia Hero, DO Allergy & Immunology  [facility]  [facility] office: [phone] [facility] office: [phone]

## 2023-07-31 NOTE — Patient Instructions (Addendum)
 Requesting records  Breathing Normal breathing test today.  May use albuterol  rescue inhaler 2 puffs every 4 to 6 hours as needed for shortness of breath, chest tightness, coughing, and wheezing.  Monitor frequency of use - if you need to use it more than twice per week on a consistent basis let us  know.   Reflux Continue lifestyle and dietary modifications. Continue pantoprazole  40mg  once day - nothing to eat or drink for 20-30 minutes afterwards.  Follow up with GI as scheduled.   Chronic rhinitis Monitor symptoms. Use over the counter antihistamines such as Zyrtec  (cetirizine ), Claritin (loratadine), Allegra (fexofenadine), or Xyzal (levocetirizine) daily as needed. May take twice a day during allergy flares. May switch antihistamines every few months. If worsening symptoms then will recommend to retest.   Follow up in 6 months or sooner if needed.

## 2023-08-21 ENCOUNTER — Ambulatory Visit: Admitting: Internal Medicine

## 2023-08-21 ENCOUNTER — Encounter: Payer: Self-pay | Admitting: Internal Medicine

## 2023-08-21 VITALS — BP 105/56 | HR 64 | Temp 98.5°F | Resp 10 | Ht 74.0 in | Wt 208.0 lb

## 2023-08-21 DIAGNOSIS — K209 Esophagitis, unspecified without bleeding: Secondary | ICD-10-CM

## 2023-08-21 DIAGNOSIS — K219 Gastro-esophageal reflux disease without esophagitis: Secondary | ICD-10-CM

## 2023-08-21 DIAGNOSIS — K2950 Unspecified chronic gastritis without bleeding: Secondary | ICD-10-CM | POA: Diagnosis not present

## 2023-08-21 DIAGNOSIS — K2970 Gastritis, unspecified, without bleeding: Secondary | ICD-10-CM

## 2023-08-21 DIAGNOSIS — K2270 Barrett's esophagus without dysplasia: Secondary | ICD-10-CM

## 2023-08-21 DIAGNOSIS — Z8619 Personal history of other infectious and parasitic diseases: Secondary | ICD-10-CM

## 2023-08-21 DIAGNOSIS — R1013 Epigastric pain: Secondary | ICD-10-CM

## 2023-08-21 MED ORDER — SODIUM CHLORIDE 0.9 % IV SOLN: 500.0000 mL | Freq: Once | INTRAVENOUS | Status: DC

## 2023-08-21 MED ORDER — PANTOPRAZOLE SODIUM 40 MG PO TBEC: 40.0000 mg | DELAYED_RELEASE_TABLET | Freq: Every day | ORAL | 11 refills | Status: DC

## 2023-08-21 NOTE — Progress Notes (Signed)
 Vss nad trans to pacu

## 2023-08-21 NOTE — Patient Instructions (Addendum)
 Resume previous diet and medications.  Pantoprazole  refilled - take one daily each morning 30-60 min before breakfast.  Appointment scheduled to follow up with Dr. Elvin Hammer on 8/22 at 3:20.  If you need to reschedule, please call Dr. Alberta Almond office directly.    YOU HAD AN ENDOSCOPIC PROCEDURE TODAY AT THE [facility]:   Refer to the procedure report that was given to you for any specific questions about what was found during the examination.  If the procedure report does not answer your questions, please call your gastroenterologist to clarify.  If you requested that your care partner not be given the details of your procedure findings, then the procedure report has been included in a sealed envelope for you to review at your convenience later.  YOU SHOULD EXPECT: Some feelings of bloating in the abdomen. Passage of more gas than usual.  Walking can help get rid of the air that was put into your GI tract during the procedure and reduce the bloating. If you had a lower endoscopy (such as a colonoscopy or flexible sigmoidoscopy) you may notice spotting of blood in your stool or on the toilet paper. If you underwent a bowel prep for your procedure, you may not have a normal bowel movement for a few days.  Please Note:  You might notice some irritation and congestion in your nose or some drainage.  This is from the oxygen used during your procedure.  There is no need for concern and it should clear up in a day or so.  SYMPTOMS TO REPORT IMMEDIATELY:  Following upper endoscopy (EGD)  Vomiting of blood or coffee ground material  New chest pain or pain under the shoulder blades  Painful or persistently difficult swallowing  New shortness of breath  Fever of 100F or higher  Black, tarry-looking stools  For urgent or emergent issues, a gastroenterologist can be reached at any hour by calling [phone]. Do not use MyChart messaging for urgent concerns.    DIET:  We do recommend a small  meal at first, but then you may proceed to your regular diet.  Drink plenty of fluids but you should avoid alcoholic beverages for 24 hours.  ACTIVITY:  You should plan to take it easy for the rest of today and you should NOT DRIVE or use heavy machinery until tomorrow (because of the sedation medicines used during the test).    FOLLOW UP: Our staff will call the number listed on your records the next business day following your procedure.  We will call around 7:15- 8:00 am to check on you and address any questions or concerns that you may have regarding the information given to you following your procedure. If we do not reach you, we will leave a message.     If any biopsies were taken you will be contacted by phone or by letter within the next 1-3 weeks.  Please call us  at [phone] if you have not heard about the biopsies in 3 weeks.    SIGNATURES/CONFIDENTIALITY: You and/or your care partner have signed paperwork which will be entered into your electronic medical record.  These signatures attest to the fact that that the information above on your After Visit Summary has been reviewed and is understood.  Full responsibility of the confidentiality of this discharge information lies with you and/or your care-partner.

## 2023-08-21 NOTE — Op Note (Signed)
 [facility] Patient Name: Dave Hernandez Procedure Date: 08/21/2023 9:48 AM MRN: [id number] Endoscopist: Murel Arlington. Elvin Hammer , MD, [id number] Age: 28 Referring MD:  Date of Birth: 20-Dec-1995 Gender: Male Account #: [id number] Procedure:                Upper GI endoscopy with biopsies Indications:              Epigastric abdominal pain, Esophageal reflux. Has                            been off PPI recently Medicines:                Monitored Anesthesia Care Procedure:                Pre-Anesthesia Assessment:                           - Prior to the procedure, a History and Physical                            was performed, and patient medications and                            allergies were reviewed. The patient's tolerance of                            previous anesthesia was also reviewed. The risks                            and benefits of the procedure and the sedation                            options and risks were discussed with the patient.                            All questions were answered, and informed consent                            was obtained. Prior Anticoagulants: The patient has                            taken no anticoagulant or antiplatelet agents. ASA                            Grade Assessment: II - A patient with mild systemic                            disease. After reviewing the risks and benefits,                            the patient was deemed in satisfactory condition to                            undergo the procedure.  After obtaining informed consent, the endoscope was                            passed under direct vision. Throughout the                            procedure, the patient's blood pressure, pulse, and                            oxygen saturations were monitored continuously. The                            Olympus Scope [id number] was introduced through the                            mouth, and  advanced to the second part of duodenum.                            The upper GI endoscopy was accomplished without                            difficulty. The patient tolerated the procedure                            well. Scope In: Scope Out: Findings:                 The esophagus revealed columnar type mucosa along                            the distalmost portion consistent with short                            segment Barrett's esophagus (C1 M1). The mucosa was                            slightly edematous. Biopsies were taken with a cold                            forceps for histology.                           The stomach revealed a moderate hiatal hernia.                            Scaly type gastric mucosa with hematin. Biopsies                            were taken with a cold forceps for histology.                           The examined duodenum revealed scattered                            superficial bulbar erosions. The postbulbar  duodenum was normal.                           The cardia and gastric fundus were normal on                            retroflexion. Complications:            No immediate complications. Estimated Blood Loss:     Estimated blood loss: none. Impression:               1. GERD with mild esophagitis and probable short                            segment Barrett's esophagus. Biopsied                           2. Gastroduodenitis. Biopsies Recommendation:           1. Patient has a contact number available for                            emergencies. The signs and symptoms of potential                            delayed complications were discussed with the                            patient. Return to normal activities tomorrow.                            Written discharge instructions were provided to the                            patient.                           2. Resume previous diet.                           3.  Continue present medications.                           4. Await pathology results.                           5. Prescribe pantoprazole  40 mg daily; #30; 11                            refills. Please take 1 each morning 30 to 60                            minutes before breakfast. Take every day                           6. Office follow-up with Dr. Elvin Hammer in 6 to 8 weeks Murel Arlington. Elvin Hammer, MD 08/21/2023 10:15:43 AM This report has been signed electronically.

## 2023-08-21 NOTE — Progress Notes (Signed)
 Pt's states no medical or surgical changes since previsit or office visit.

## 2023-08-21 NOTE — Progress Notes (Signed)
 Expand All Collapse All    Chief Complaint: Hospital follow-up Primary GI MD: Unassigned   HPI: Discussed the use of AI scribe software for clinical note transcription with the patient, who gave verbal consent to proceed.   History of Present Illness   Dave Hernandez is a 28 year old male with gastroesophageal reflux disease who presents with chest pain after eating spicy food.   He experiences severe chest pain after consuming spicy foods, such as tacos with green hot sauce, pizza, and lime. The pain is described as one of the worst feelings he has ever had, initially fearing it was a heart attack. It began approximately 15 minutes after eating and persisted despite taking Melanta and Tums. The pain worsens when lying down, preventing sleep until 2 AM.   He has a history of gastroesophageal reflux disease and is currently taking pantoprazole  40 mg once daily in the morning. Despite this, he continues to experience symptoms, particularly after consuming certain foods.   He recalls a previous diagnosis of H. pylori for which he was treated, but he is unsure if it has recurred. He underwent a CT scan last year and recently, both of which were normal.    He mentions trying to watch his diet by avoiding soda and limiting his food intake to once a day, typically eating a banana in the morning and a meal in the afternoon.     At our last office visit August 2024 we did not have insurance so he could not undergo EGD or stool study testing.  He presents today with insurance and is ready for further workup.   PREVIOUS GI WORKUP    CT chest abdomen pelvis with contrast 05/19/2023 with no acute abnormality.  Normal stomach, bowel, liver, pancreas, gallbladder.  Incidental kidney stone       Past Medical History:  Diagnosis Date   Angio-edema     Anxiety     Asthma      as a child, no inhaler   COVID-19     Depression     GERD (gastroesophageal reflux disease)     Headache     History of  kidney stones      passed stones, no surgery   Pneumonia      x 1   PONV (postoperative nausea and vomiting)     Seasonal allergies                 Past Surgical History:  Procedure Laterality Date   TONSILLECTOMY Bilateral 02/01/2023    Procedure: TONSILLECTOMY;  Surgeon: Artice Last, MD;  Location: [facility] OR;  Service: ENT;  Laterality: Bilateral;   TONSILLECTOMY       WISDOM TOOTH EXTRACTION                    Current Outpatient Medications  Medication Sig Dispense Refill   acetaminophen  (TYLENOL ) 500 MG tablet Take 1 tablet (500 mg total) by mouth every 6 (six) hours for 2-3 days then take every 6 hours as needed 30 tablet 0   albuterol  (VENTOLIN  HFA) 108 (90 Base) MCG/ACT inhaler Inhale 2 puffs into the lungs every 4 (four) hours as needed for wheezing or shortness of breath (coughing fits). 18 g 0   Cholecalciferol (VITAMIN D3) 1.25 MG (50000 UT) CAPS Take by mouth.       dicyclomine (BENTYL) 20 MG tablet Take 20 mg by mouth 3 (three) times daily as needed.       pantoprazole  (PROTONIX )  40 MG tablet Take 40 mg by mouth daily.       sertraline (ZOLOFT) 25 MG tablet Take 25 mg by mouth daily.       sucralfate  (CARAFATE ) 1 g tablet Take 1 tablet (1 g total) by mouth 4 (four) times daily -  with meals and at bedtime. (Patient not taking: Reported on 06/07/2023) 30 tablet 0      No current facility-administered medications for this visit.             Allergies as of 06/07/2023 - Review Complete 06/07/2023  Allergen Reaction Noted   Penicillins Other (See Comments) 07/17/2016           Family History  Problem Relation Age of Onset   Heart attack Mother     Angioedema Father     Diabetes Father     Prostate cancer Paternal Uncle     Diabetes Paternal Uncle     Heart failure Maternal Grandfather     Breast cancer Paternal Grandmother     Diabetes Paternal Grandfather            Social History         Socioeconomic History   Marital status: Single       Spouse name: Not on file   Number of children: 0   Years of education: Not on file   Highest education level: Not on file  Occupational History   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  Tobacco Use   Smoking status: Former      Current packs/day: 0.00      Types: Cigarettes      Quit date: 2018      Years since quitting: 7.2   Smokeless tobacco: Never  Vaping Use   Vaping status: Former   Quit date: 03/19/2016  Substance and Sexual Activity   Alcohol use: Not Currently      Comment: rarely   Drug use: Yes      Types: Marijuana      Comment: Last use - 11/2022   Sexual activity: Yes      Birth control/protection: Condom  Other Topics Concern   Not on file  Social History Narrative   Not on file    Social Drivers of Health    Financial Resource Strain: Not on file  Food Insecurity: Not on file  Transportation Needs: Not on file  Physical Activity: Not on file  Stress: Not on file  Social Connections: Not on file  Intimate Partner Violence: Not on file      Review of Systems:    Constitutional: No weight loss, fever, chills, weakness or fatigue HEENT: Eyes: No change in vision               Ears, Nose, Throat:  No change in hearing or congestion Skin: No rash or itching Cardiovascular: No chest pain, chest pressure or palpitations   Respiratory: No SOB or cough Gastrointestinal: See HPI and otherwise negative Genitourinary: No dysuria or change in urinary frequency Neurological: No headache, dizziness or syncope Musculoskeletal: No new muscle or joint pain Hematologic: No bleeding or bruising Psychiatric: No history of depression or anxiety      Physical Exam:  Vital signs: BP (!) 144/82 (BP Location: Right Arm, Patient Position: Sitting, Cuff Size: Normal)   Pulse 86   Ht 6\' 2"  (1.88 m)   Wt 208 lb 9.6 oz (94.6 kg)   BMI 26.78 kg/m    Constitutional: NAD, Well developed, Well nourished, alert and cooperative  Head:  Normocephalic and atraumatic. Eyes:   PEERL, EOMI. No  icterus. Conjunctiva pink. Respiratory: Respirations even and unlabored. Lungs clear to auscultation bilaterally.   No wheezes, crackles, or rhonchi.  Cardiovascular:  Regular rate and rhythm. No peripheral edema, cyanosis or pallor.  Gastrointestinal:  Soft, nondistended, nontender. No rebound or guarding. Normal bowel sounds. No appreciable masses or hepatomegaly. Rectal:  Not performed.  Msk:  Symmetrical without gross deformities. Without edema, no deformity or joint abnormality.  Neurologic:  Alert and  oriented x4;  grossly normal neurologically.  Skin:   Dry and intact without significant lesions or rashes. Psychiatric: Oriented to person, place and time. Demonstrates good judgement and reason without abnormal affect or behaviors.       RELEVANT LABS AND IMAGING: CBC Labs (Brief)          Component Value Date/Time    WBC 4.7 06/06/2023 1138    RBC 5.39 06/06/2023 1138    HGB 16.0 06/06/2023 1138    HCT 46.7 06/06/2023 1138    PLT 264 06/06/2023 1138    MCV 86.6 06/06/2023 1138    MCH 29.7 06/06/2023 1138    MCHC 34.3 06/06/2023 1138    RDW 12.7 06/06/2023 1138    LYMPHSABS 2.5 01/06/2023 1325    MONOABS 0.8 01/06/2023 1325    EOSABS 0.0 01/06/2023 1325    BASOSABS 0.0 01/06/2023 1325        CMP     Labs (Brief)          Component Value Date/Time    NA 136 06/06/2023 1138    NA 141 04/09/2019 1648    K 4.0 06/06/2023 1138    CL 102 06/06/2023 1138    CO2 26 06/06/2023 1138    GLUCOSE 100 (H) 06/06/2023 1138    BUN 8 06/06/2023 1138    BUN 10 04/09/2019 1648    CREATININE 1.02 06/06/2023 1138    CALCIUM 9.2 06/06/2023 1138    PROT 7.5 10/20/2022 1034    ALBUMIN 4.3 10/20/2022 1034    AST 21 10/20/2022 1034    ALT 37 10/20/2022 1034    ALKPHOS 51 10/20/2022 1034    BILITOT 1.0 10/20/2022 1034    GFRNONAA >60 06/06/2023 1138    GFRAA 135 04/09/2019 1648          Assessment/Plan:    GERD Epigastric pain History of H. pylori infection Longstanding  history of GERD without previous EGD.  Questionable H. pylori infection via breath test from PCP (we do not have these records available) s/p antibiotic treatment without eradication study.  Recent flareup and evaluated in ED after eating pizza/tacos/salsa Verde.  Remains on pantoprazole  40 Mg once daily which overall controls his symptoms but sometimes he has breakthrough. BID PPI causes tension headaches.  DDx includes esophagitis, gastritis, PUD, recurrent H. pylori - EGD with biopsies for further evaluation to rule out H. pylori and Barrett's esophagus - I thoroughly discussed the procedure with the patient (at bedside) to include nature of the procedure, alternatives, benefits, and risks (including but not limited to bleeding, infection, perforation, anesthesia/cardiac pulmonary complications).  Patient verbalized understanding and gave verbal consent to proceed with procedure. - Educated patient on lifestyle modifications and provided patient education handouts - Further management based on EGD findings    Recent H&P as above.  No interval change.  Now for upper endoscopy

## 2023-08-22 ENCOUNTER — Telehealth: Payer: Self-pay | Admitting: Internal Medicine

## 2023-08-22 ENCOUNTER — Telehealth: Payer: Self-pay

## 2023-08-22 NOTE — Telephone Encounter (Signed)
  Follow up Call-     08/21/2023    8:36 AM  Call back number  Post procedure Call Back phone  # [phone]  Permission to leave phone message Yes     Patient questions:  Do you have a fever, pain , or abdominal swelling? No. Pain Score  0 *  Have you tolerated food without any problems? Yes.    Have you been able to return to your normal activities? Yes.    Do you have any questions about your discharge instructions: Diet   No. Medications  No. Follow up visit  No.  Do you have questions or concerns about your Care? No.  Actions: * If pain score is 4 or above: No action needed, pain <4.

## 2023-08-22 NOTE — Telephone Encounter (Signed)
 PT had an EGD yesterday and wanted to let the team know that his chest is very tight and that it hurts when he breathes. Please advise.

## 2023-08-23 ENCOUNTER — Telehealth: Payer: Self-pay | Admitting: *Deleted

## 2023-08-23 NOTE — Telephone Encounter (Signed)
  Follow up Call-     08/21/2023    8:36 AM  Call back number  Post procedure Call Back phone  # [phone]  Permission to leave phone message Yes     Patient questions:  Do you have a fever, pain , or abdominal swelling? No. Pain Score  0 *  Have you tolerated food without any problems? Yes.    Have you been able to return to your normal activities? Yes.    Do you have any questions about your discharge instructions: Diet   No. Medications  No. Follow up visit  No.  Do you have questions or concerns about your Care? No.  Actions: * If pain score is 4 or above: No action needed, pain <4.

## 2023-08-27 LAB — SURGICAL PATHOLOGY

## 2023-08-28 ENCOUNTER — Encounter: Payer: Self-pay | Admitting: Cardiology

## 2023-08-28 ENCOUNTER — Ambulatory Visit: Payer: Self-pay | Admitting: Internal Medicine

## 2023-08-28 ENCOUNTER — Ambulatory Visit: Attending: Cardiovascular Disease | Admitting: Cardiology

## 2023-08-28 VITALS — BP 118/66 | HR 64 | Resp 16 | Ht 74.0 in | Wt 202.4 lb

## 2023-08-28 DIAGNOSIS — R0609 Other forms of dyspnea: Secondary | ICD-10-CM | POA: Diagnosis not present

## 2023-08-28 DIAGNOSIS — Z13220 Encounter for screening for lipoid disorders: Secondary | ICD-10-CM

## 2023-08-28 DIAGNOSIS — R072 Precordial pain: Secondary | ICD-10-CM | POA: Insufficient documentation

## 2023-08-28 NOTE — Progress Notes (Signed)
 Cardiology Office Note:  .   Date:  08/28/2023  ID:  Dave Hernandez, DOB 06-22-1995, MRN [id number] PCP: Granville Layer, NP  [facility] Providers Cardiologist:  Fransico Ivy, MD PCP: Granville Layer, NP  Chief Complaint  Patient presents with   Chest Pain   New Patient (Initial Visit)     Dave Hernandez is a 28 y.o. male with chest pain, exertional dyspnea, family history of early CAD  Discussed the use of AI scribe software for clinical note transcription with the patient, who gave verbal consent to proceed.  History of Present Illness Dave Hernandez is a 28 year old male who presents with chest tightness and shortness of breath.  He experiences chest tightness and shortness of breath primarily during physical activities such as running or lifting heavy objects, noticeable since the beginning of the year. Episodes of continuous heart pounding occur, which he finds unusual. He visited the emergency room a couple of months ago but was not diagnosed with a heart attack.  Family history is significant for heart problems, with his mother having had a massive heart attack in her mid-forties and his grandfather having had congestive heart failure.  He has asthma and bronchitis from childhood and occasionally uses an inhaler. Spirometry testing about a month ago was normal. He denies smoking cigarettes but occasionally uses THC products and reports minimal alcohol consumption. Seasonal allergies, particularly to pollen, have worsened over the past few years. No syncope, leg swelling, or other significant symptoms.  Of note, he was evaluated by allergy, immunology.  Spirometry was normal.  His symptoms are thought to be related to reactive airway disease.      Vitals:   08/28/23 1545  BP: 118/66  Pulse: 64  Resp: 16  SpO2: 97%      Review of Systems  Cardiovascular:  Positive for chest pain and dyspnea on exertion. Negative for leg swelling, palpitations and  syncope.        Studies Reviewed: Dave Hernandez        EKG 08/28/2023: Normal sinus rhythm Rightward axis When compared with ECG of 06-Jun-2023 10:56, No significant change was found     Independently interpreted 05/2023: Hb 16 Cr 1.02 Trop HS 4   Physical Exam Vitals and nursing note reviewed.  Constitutional:      General: He is not in acute distress. Neck:     Vascular: No JVD.  Cardiovascular:     Rate and Rhythm: Normal rate and regular rhythm.     Heart sounds: Normal heart sounds. No murmur heard. Pulmonary:     Effort: Pulmonary effort is normal.     Breath sounds: Normal breath sounds. No wheezing or rales.  Musculoskeletal:     Right lower leg: No edema.     Left lower leg: No edema.      VISIT DIAGNOSES:   ICD-10-CM   1. Precordial pain  R07.2 EKG 12-Lead       Dave Hernandez is a 28 y.o. male with chest pain, exertional dyspnea, family history of early CAD  Assessment & Plan Exertional dyspnea, chest pain: Intermittent chest pain and palpitations with activity. Family history of premature coronary artery disease. Low cardiac suspicion, but diagnostic tests advised to exclude cardiac issues. - Order cholesterol screening lipid panel.. - Order treadmill stress test with EKG. - Order CT scan for coronary artery calcium scoring  - Order echocardiogram.  Reactive airway disease Mild intermittent reactive airway disease. Normal spirometry. Likely contributing to dyspnea and chest  tightness. - Continue follow-up with allergist.   Informed Consent   Shared Decision Making/Informed Consent The risks [chest pain, shortness of breath, cardiac arrhythmias, dizziness, blood pressure fluctuations, myocardial infarction, stroke/transient ischemic attack, and life-threatening complications (estimated to be 1 in 10,000)], benefits (risk stratification, diagnosing coronary artery disease, treatment guidance) and alternatives of an exercise tolerance test were discussed  in detail with Dave Hernandez and he agrees to proceed.       F/u as needed  Signed, Cody Das, MD

## 2023-08-28 NOTE — Patient Instructions (Signed)
 Lab Work: Lipid panel   If you have labs (blood work) drawn today and your tests are completely normal, you will receive your results only by: MyChart Message (if you have MyChart) OR A paper copy in the mail If you have any lab test that is abnormal or we need to change your treatment, we will call you to review the results.  Testing/Procedures: GXT  Exercise Tolerance Test  Please arrive 15 minutes prior to your appointment time for registration and insurance purposes.  The test will take approximately 45 minutes to complete.  How to prepare for your Exercise Stress Test: Do bring a list of your current medications with you.  If not listed below, you may take your medications as normal. Do wear comfortable clothes (no dresses or overalls) and walking shoes, tennis shoes preferred (no heels or open toed shoes are allowed) Do Not wear cologne, perfume, aftershave or lotions (deodorant is allowed).  If these instructions are not followed, your test will have to be rescheduled.  If you have questions or concerns about your appointment, you can call the Stress Lab at [phone].  If you cannot keep your appointment, please provide 24 hours notification to the Stress Lab, to avoid a possible $50 charge to your account.  CALCIUM SCORE  CT scanning for a cardiac calcium score (CAT scanning), is a noninvasive, special x-ray that produces cross-sectional images of the body using x-rays and a computer. CT scans help physicians diagnose and treat medical conditions. For some CT exams, a contrast material is used to enhance visibility in the area of the body being studied. CT scans provide greater clarity and reveal more details than regular x-ray exams.   ECHO  Your physician has requested that you have an echocardiogram. Echocardiography is a painless test that uses sound waves to create images of your heart. It provides your doctor with information about the size and shape of your heart  and how well your heart's chambers and valves are working. This procedure takes approximately one hour. There are no restrictions for this procedure. Please do NOT wear cologne, perfume, aftershave, or lotions (deodorant is allowed). Please arrive 15 minutes prior to your appointment time.  Please note: We ask at that you not bring children with you during ultrasound (echo/ vascular) testing. Due to room size and safety concerns, children are not allowed in the ultrasound rooms during exams. Our front office staff cannot provide observation of children in our lobby area while testing is being conducted. An adult accompanying a patient to their appointment will only be allowed in the ultrasound room at the discretion of the ultrasound technician under special circumstances. We apologize for any inconvenience.   Follow-Up: At [facility], you and your health needs are our priority.  As part of our continuing mission to provide you with exceptional heart care, our providers are all part of one team.  This team includes your primary Cardiologist (physician) and Advanced Practice Providers or APPs (Physician Assistants and Nurse Practitioners) who all work together to provide you with the care you need, when you need it.  Your next appointment:   AS NEEDED   Provider:   Cody Das, MD

## 2023-08-29 ENCOUNTER — Ambulatory Visit: Payer: Self-pay

## 2023-08-29 LAB — LIPID PANEL
Chol/HDL Ratio: 4.1 ratio (ref 0.0–5.0)
Cholesterol, Total: 124 mg/dL (ref 100–199)
HDL: 30 mg/dL — ABNORMAL LOW (ref >39)
LDL Chol Calc (NIH): 67 mg/dL (ref 0–99)
Triglycerides: 153 mg/dL — ABNORMAL HIGH (ref 0–149)
VLDL Cholesterol Cal: 27 mg/dL (ref 5–40)

## 2023-09-05 ENCOUNTER — Ambulatory Visit (HOSPITAL_BASED_OUTPATIENT_CLINIC_OR_DEPARTMENT_OTHER)
Admission: RE | Admit: 2023-09-05 | Discharge: 2023-09-05 | Disposition: A | Discharge status: Home / Self Care | Payer: Self-pay | Source: Ambulatory Visit | Attending: Cardiology | Admitting: Cardiology

## 2023-09-05 DIAGNOSIS — R072 Precordial pain: Secondary | ICD-10-CM | POA: Insufficient documentation

## 2023-09-06 ENCOUNTER — Other Ambulatory Visit (HOSPITAL_BASED_OUTPATIENT_CLINIC_OR_DEPARTMENT_OTHER)

## 2023-09-12 ENCOUNTER — Telehealth (HOSPITAL_COMMUNITY): Payer: Self-pay | Admitting: *Deleted

## 2023-09-12 NOTE — Telephone Encounter (Signed)
 Spoke to patient as a reminder regarding his ETT on 09/19/23 at 2:45.

## 2023-09-19 ENCOUNTER — Ambulatory Visit (HOSPITAL_COMMUNITY)
Admission: RE | Admit: 2023-09-19 | Discharge: 2023-09-19 | Disposition: A | Discharge status: Home / Self Care | Source: Ambulatory Visit | Attending: Cardiology | Admitting: Cardiology

## 2023-09-19 DIAGNOSIS — R072 Precordial pain: Secondary | ICD-10-CM | POA: Diagnosis present

## 2023-09-19 LAB — EXERCISE TOLERANCE TEST
Angina Index: 0
Duke Treadmill Score: 9
Estimated workload: 10.1
Exercise duration (min): 9 min
Exercise duration (sec): 0 s
MPHR: 192 {beats}/min
Peak HR: 171 {beats}/min
Percent HR: 89 %
Rest HR: 67 {beats}/min
ST Depression (mm): 0 mm

## 2023-09-24 ENCOUNTER — Telehealth: Payer: Self-pay | Admitting: Cardiology

## 2023-09-24 NOTE — Telephone Encounter (Signed)
 Patient called to follow-up on Treadmill test results.

## 2023-09-24 NOTE — Telephone Encounter (Signed)
 The patient has been notified of the result and verbalized understanding.  All questions (if any) were answered. Dave Hernandez HERO, LPN 04/19/7972 7:77 PM

## 2023-09-24 NOTE — Telephone Encounter (Signed)
 Attempted to call patient. Unable to leave voicemail.     Normal exercise treadmill stress test.   Regards, Dr. Elmira    Written by Newman JINNY Elmira, MD on 09/19/2023  5:25 PM EDT Seen by patient Dave Hernandez on 09/19/2023  5:30 PM

## 2023-09-24 NOTE — Telephone Encounter (Signed)
 Pt is returning call to a nurse for results

## 2023-10-11 ENCOUNTER — Ambulatory Visit (HOSPITAL_COMMUNITY)
Admission: RE | Admit: 2023-10-11 | Discharge: 2023-10-11 | Disposition: A | Discharge status: Home / Self Care | Source: Ambulatory Visit | Attending: Cardiology | Admitting: Cardiology

## 2023-10-11 DIAGNOSIS — R072 Precordial pain: Secondary | ICD-10-CM | POA: Insufficient documentation

## 2023-10-11 DIAGNOSIS — R0609 Other forms of dyspnea: Secondary | ICD-10-CM | POA: Insufficient documentation

## 2023-10-11 LAB — ECHOCARDIOGRAM COMPLETE
Area-P 1/2: 3.34 cm2
Est EF: 55
S' Lateral: 3.7 cm

## 2023-10-25 NOTE — Telephone Encounter (Signed)
 Patient called and stated that he would like to speak to the nurse in regards to his pathology result. Please advise.

## 2023-11-08 ENCOUNTER — Encounter: Payer: Self-pay | Admitting: Internal Medicine

## 2023-11-08 ENCOUNTER — Ambulatory Visit: Admitting: Internal Medicine

## 2023-11-08 VITALS — BP 126/74 | HR 78 | Ht 75.0 in | Wt 195.0 lb

## 2023-11-08 DIAGNOSIS — R1013 Epigastric pain: Secondary | ICD-10-CM

## 2023-11-08 DIAGNOSIS — K2270 Barrett's esophagus without dysplasia: Secondary | ICD-10-CM | POA: Diagnosis not present

## 2023-11-08 DIAGNOSIS — K219 Gastro-esophageal reflux disease without esophagitis: Secondary | ICD-10-CM | POA: Diagnosis not present

## 2023-11-08 DIAGNOSIS — R0789 Other chest pain: Secondary | ICD-10-CM | POA: Diagnosis not present

## 2023-11-08 MED ORDER — PANTOPRAZOLE SODIUM 40 MG PO TBEC: 40.0000 mg | DELAYED_RELEASE_TABLET | Freq: Every day | ORAL | 3 refills | Status: AC

## 2023-11-08 NOTE — Progress Notes (Signed)
 HISTORY OF PRESENT ILLNESS:  Dave Hernandez is a 28 y.o. male who was evaluated in this office by the GI physician assistant June 07, 2023 regarding reflux disease and chest pain.  See that dictation.  He was on pantoprazole  40 mg at that time.  He subsequently underwent upper endoscopy August 21, 2003.  He was found to have mild esophagitis and short segment Barrett's esophagus which was confirmed with biopsies.  Medically stable.  He was also noted to have mild gastroduodenitis.  Biopsies were negative for Helicobacter pylori.  Since he is accompanied today by his significant other.  He tells me that he has done well since his colonoscopy.  Reports just 1 episode of reflux related discomfort.  No dysphagia.  He had a number of excellent questions regarding reflux disease, hiatal hernia, and Barrett's esophagus.  As well, his medication.  REVIEW OF SYSTEMS:  All non-GI ROS negative except for sinus and allergy, anxiety, back pain, headaches, hearing problems, muscle cramps, swollen lymph glands  Past Medical History:  Diagnosis Date   Angio-edema    Anxiety    Asthma    as a child, no inhaler   COVID-19    Depression    GERD (gastroesophageal reflux disease)    Headache    History of kidney stones    passed stones, no surgery   Pneumonia    x 1   PONV (postoperative nausea and vomiting)    Seasonal allergies     Past Surgical History:  Procedure Laterality Date   TONSILLECTOMY Bilateral 02/01/2023   Procedure: TONSILLECTOMY;  Surgeon: Okey Burns, MD;  Location: [facility] OR;  Service: ENT;  Laterality: Bilateral;   TONSILLECTOMY     WISDOM TOOTH EXTRACTION      Social History Dave Hernandez  reports that he quit smoking about 7 years ago. His smoking use included cigarettes. He has never used smokeless tobacco. He reports that he does not currently use alcohol. He reports current drug use. Drug: Marijuana.  family history includes Angioedema in his father; Breast cancer in  his paternal grandmother; Diabetes in his father, paternal grandfather, and paternal uncle; Heart attack in his mother; Heart failure in his maternal grandfather; Prostate cancer in his paternal uncle.  Allergies  Allergen Reactions   Penicillins Other (See Comments)    Childhood allergy - Patient tolerated Augmentin  well in ED without signs of anaphylaxis or any other allergic reaction.        PHYSICAL EXAMINATION: Vital signs: BP 126/74 (BP Location: Left Arm, Patient Position: Sitting, Cuff Size: Normal)   Pulse 78   Ht 6' 3 (1.905 m)   Wt 195 lb (88.5 kg)   BMI 24.37 kg/m  General: Pleasant, well-developed, well-nourished, no acute distress HEENT: Sclerae are anicteric. Abdomen: Not reexamined. Extremities: No abnormalities on the visible extremities Psychiatric: alert and oriented x3. Cooperative   ASSESSMENT:  1.  GERD complicated by short segment Barrett's esophagus.  Doing well post endoscopy on pantoprazole . 2.  Chest pain.  Likely GERD.   PLAN:  1.  Reflux precautions 2.  Continue pantoprazole  40 mg daily.  Prescription refilled.  Medication risk reviewed 3.  Routine office follow-up 1 year 4.  Contact the office in the interim for questions or problems 5.  Planning repeat surveillance upper endoscopy in 3 years.  He understands Total time of 30 minutes was spent preparing to see the patient, obtaining interval history, performing medically appropriate physical examination, counseling the patient and educating the patient regarding the above listed  issues, answering questions, defining follow-up and surveillance intervals, and documenting clinical information in the health record

## 2023-11-08 NOTE — Patient Instructions (Addendum)
 We have sent the following medications to your pharmacy for you to pick up at your convenience:  Pantoprazole .  Please follow up in one year.  _______________________________________________________  If your blood pressure at your visit was 140/90 or greater, please contact your primary care physician to follow up on this.  _______________________________________________________  If you are age 28 or older, your body mass index should be between 23-30. Your Body mass index is 24.37 kg/m. If this is out of the aforementioned range listed, please consider follow up with your Primary Care Provider.  If you are age 66 or younger, your body mass index should be between 19-25. Your Body mass index is 24.37 kg/m. If this is out of the aformentioned range listed, please consider follow up with your Primary Care Provider.   ________________________________________________________  The [place] [facility] providers would like to encourage you to use MYCHART to communicate with providers for non-urgent requests or questions.  Due to long hold times on the telephone, sending your provider a message by [facility] may be a faster and more efficient way to get a response.  Please allow 48 business hours for a response.  Please remember that this is for non-urgent requests.  _______________________________________________________  [facility] is using a team-based approach to care.  Your team is made up of your doctor and two to three APPS. Our APPS (Nurse Practitioners and Physician Assistants) work with your physician to ensure care continuity for you. They are fully qualified to address your health concerns and develop a treatment plan. They communicate directly with your gastroenterologist to care for you. Seeing the Advanced Practice Practitioners on your physician's team can help you by facilitating care more promptly, often allowing for earlier appointments, access to diagnostic testing, procedures, and  other specialty referrals.

## 2023-11-14 ENCOUNTER — Telehealth (INDEPENDENT_AMBULATORY_CARE_PROVIDER_SITE_OTHER): Payer: Self-pay

## 2023-11-14 NOTE — Telephone Encounter (Signed)
 I called the patient to move the appointment with Dr Anice, there is no voicemail set up yet on his only contact number.

## 2023-11-19 ENCOUNTER — Institutional Professional Consult (permissible substitution) (INDEPENDENT_AMBULATORY_CARE_PROVIDER_SITE_OTHER)

## 2023-12-16 ENCOUNTER — Other Ambulatory Visit (HOSPITAL_COMMUNITY): Payer: Self-pay | Admitting: Family

## 2023-12-16 DIAGNOSIS — E0590 Thyrotoxicosis, unspecified without thyrotoxic crisis or storm: Secondary | ICD-10-CM

## 2023-12-23 ENCOUNTER — Encounter (HOSPITAL_COMMUNITY): Payer: Self-pay

## 2023-12-23 ENCOUNTER — Encounter (HOSPITAL_COMMUNITY)
Admission: RE | Admit: 2023-12-23 | Discharge: 2023-12-23 | Disposition: A | Discharge status: Home / Self Care | Source: Ambulatory Visit | Attending: Family | Admitting: Family

## 2023-12-23 DIAGNOSIS — E0590 Thyrotoxicosis, unspecified without thyrotoxic crisis or storm: Secondary | ICD-10-CM | POA: Diagnosis present

## 2023-12-23 MED ORDER — SODIUM IODIDE I-123 7.4 MBQ CAPS: 416.0000 | ORAL_CAPSULE | Freq: Once | ORAL | Status: DC

## 2023-12-24 ENCOUNTER — Encounter (HOSPITAL_COMMUNITY)
Admission: RE | Admit: 2023-12-24 | Discharge: 2023-12-24 | Disposition: A | Discharge status: Home / Self Care | Source: Ambulatory Visit | Attending: Family | Admitting: Family

## 2024-01-09 ENCOUNTER — Ambulatory Visit (INDEPENDENT_AMBULATORY_CARE_PROVIDER_SITE_OTHER): Admitting: Audiology

## 2024-01-09 ENCOUNTER — Encounter (INDEPENDENT_AMBULATORY_CARE_PROVIDER_SITE_OTHER): Payer: Self-pay

## 2024-01-09 ENCOUNTER — Ambulatory Visit (INDEPENDENT_AMBULATORY_CARE_PROVIDER_SITE_OTHER)

## 2024-01-09 VITALS — BP 131/80 | HR 57 | Wt 189.0 lb

## 2024-01-09 DIAGNOSIS — H903 Sensorineural hearing loss, bilateral: Secondary | ICD-10-CM | POA: Diagnosis not present

## 2024-01-09 DIAGNOSIS — R6884 Jaw pain: Secondary | ICD-10-CM

## 2024-01-09 DIAGNOSIS — H9319 Tinnitus, unspecified ear: Secondary | ICD-10-CM

## 2024-01-09 DIAGNOSIS — M26609 Unspecified temporomandibular joint disorder, unspecified side: Secondary | ICD-10-CM

## 2024-01-09 NOTE — Progress Notes (Signed)
  [address] [place], [place] [address] [phone]  Audiological Evaluation    Name: Dave Hernandez     DOB:   [birth year]      MRN:   [id number]                                                                                     Service Date: 01/09/2024     Accompanied by: unaccompanied   Patient comes today after Dr. Mila, ENT sent a referral for a hearing evaluation due to concerns with Eustachian tube dysfunction.   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 Left ear:  Clear external ear canal and notable landmarks visualized on the tympanic membrane.  Tympanometry: Right ear: Normal external ear canal volume with normal middle ear pressure and tympanic membrane compliance (Type A). Findings are suggestive of normal middle ear function. Left ear: Normal external ear canal volume with normal middle ear pressure and tympanic membrane compliance (Type A). Findings are suggestive of normal middle ear function.    Hearing Evaluation The hearing test results were completed under headphones and results are deemed to be of good reliability. Test technique:  conventional    Pure tone Audiometry: Right ear- Normal hearing from [phone], then moderate presumably sensorineural hearing loss from 6000 Hz - 8000 Hz. Left ear-  Normal hearing from [phone] Hz, then moderately severe to severe sensorineural hearing loss from 4000 Hz - 8000 Hz.  Speech Audiometry: Right ear- Speech Reception Threshold (SRT) was obtained at 10 dBHL. Left  ear-Speech Reception Threshold (SRT) was obtained at 10 dBHL.   Word Recognition Score Tested using NU-6 (recorded) Right ear: 100% was obtained at a presentation level of 60 dBHL with contralateral masking which is deemed as  excellent. Left ear: 100% was obtained at a presentation level of 60 dBHL with contralateral masking which is deemed as  excellent.   Impression: There is a significant difference in pure-tone thresholds between ears, worse in the left from 4000-8000 Hz..   Recommendations: Follow up with ENT as scheduled for today. Return for a hearing evaluation if concerns with hearing changes arise or per MD recommendation. Use hearing protection when exposed to loud/damaging sounds.    Damoney Julia MARIE LEROUX-MARTINEZ, AUD

## 2024-01-09 NOTE — Progress Notes (Unsigned)
 HPI:   Dave Hernandez is a 28 y.o. male who presents as an established patient being seen in consultation for left ear pressure.  Patient states that he had left ear pain and pressure approximately 3 months ago.  He did not notice any drainage from the ear.  Patient did get antibiotics and prednisone  from his PCP which improved the symptoms.  The pain is resolved at this time.  He does note left musculoskeletal neck pain however.  Patient also notes tinnitus which is high-pitched and comes and goes.  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  Patient states that he has been more cognizant of improving his hearing protection.  He denies any worsening of his ear symptoms.  The patient did have an audiology evaluation today showing high-frequency hearing loss.  Patient also states that he has clicking in his jaw.  He has not seen his dentist for this issue.  He does state that he feels he grinds his teeth at night.   PMH/Meds/All/SocHx/FamHx/ROS: Past Medical History:  Diagnosis Date   Angio-edema    Anxiety    Asthma    as a child, no inhaler   COVID-19    Depression    GERD (gastroesophageal reflux disease)    Headache    History of kidney stones    passed stones, no surgery   Pneumonia    x 1   PONV (postoperative nausea and vomiting)    Seasonal allergies    Past Surgical History:  Procedure Laterality Date   TONSILLECTOMY Bilateral 02/01/2023   Procedure: TONSILLECTOMY;  Surgeon: Okey Burns, MD;  Location: [facility] OR;  Service: ENT;  Laterality: Bilateral;   TONSILLECTOMY     WISDOM TOOTH EXTRACTION     No family history of bleeding disorders, wound healing problems or difficulty with anesthesia.  Social Connections: Not on file    Current Outpatient Medications:    acetaminophen  (TYLENOL ) 500 MG tablet, Take 1 tablet (500 mg total) by mouth every 6 (six) hours for 2-3 days then take every 6 hours as needed, Disp: 30 tablet, Rfl: 0   albuterol  (VENTOLIN   HFA) 108 (90 Base) MCG/ACT inhaler, Inhale 2 puffs into the lungs every 4 (four) hours as needed for wheezing or shortness of breath (coughing fits)., Disp: 18 g, Rfl: 0   pantoprazole  (PROTONIX ) 40 MG tablet, Take 1 tablet (40 mg total) by mouth daily before breakfast., Disp: 90 tablet, Rfl: 3   levocetirizine (XYZAL) 5 MG tablet, Take 5 mg by mouth at bedtime. (Patient not taking: Reported on 01/09/2024), Disp: , Rfl:  A complete ROS was performed with pertinent positives/negatives noted in the HPI. The remainder of the ROS are negative.   Physical Exam:  BP 131/80 (BP Location: Right Arm, Patient Position: Sitting, Cuff Size: Normal)   Pulse (!) 57   Wt 189 lb (85.7 kg)   SpO2 97%   BMI 23.62 kg/m  General: Well developed, well nourished. No acute distress. Voice normal Head/Face: Normocephalic. No sinus tenderness. Facial nerve intact and equal bilaterally. No facial lacerations. Eyes: PERRL, no scleral icterus or conjunctival hemorrhage. EOMI. Ears: No gross deformity. Normal external canal. Tympanic membrane in tact bilaterally Hearing: Normal speech reception.  Nose: No gross deformity or lesions. No purulent discharge. No turbinate hypertrophy. Mouth/Oropharynx: Lips without any lesions. Dentition good. No mucosal lesions within the oropharynx. No tonsillar enlargement, exudate, or lesions. Pharyngeal walls symmetrical. Uvula midline. Tongue midline without lesions. Larynx: See TFL if applicable Nasopharynx: See TFL  if applicable Neck: Trachea midline. No masses. No thyromegaly or nodules palpated. No crepitus. Lymphatic: No lymphadenopathy in the neck. Respiratory: No stridor or distress. Room air. Cardiovascular: Regular rate and rhythm. Extremities: No edema or cyanosis. Warm and well-perfused. Skin: No scars or lesions on face or neck. Neurologic: CN II-XII grossly intact. Moving all extremities without gross abnormality. Other:  Independent Review of Additional Tests or  Records: Audiogram 01/09/2024 - moderately severe to severe SNHL in left ear >4000Hz , symmetric SRT bilaterally at 10dBHL, Type A tympanograms bilaterally   Procedures: None  Impression & Plans: Dave Hernandez is a 28 y.o. male with ear fullness, tinnitus, and jaw pain.  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

## 2024-01-15 ENCOUNTER — Encounter: Payer: Self-pay | Admitting: Audiology

## 2024-01-23 ENCOUNTER — Emergency Department (HOSPITAL_COMMUNITY): Admission: EM | Admit: 2024-01-23 | Discharge: 2024-01-23 | Disposition: A | Discharge status: Home / Self Care

## 2024-01-23 ENCOUNTER — Emergency Department (HOSPITAL_COMMUNITY)

## 2024-01-23 ENCOUNTER — Other Ambulatory Visit: Payer: Self-pay

## 2024-01-23 DIAGNOSIS — R110 Nausea: Secondary | ICD-10-CM | POA: Diagnosis not present

## 2024-01-23 DIAGNOSIS — R300 Dysuria: Secondary | ICD-10-CM | POA: Insufficient documentation

## 2024-01-23 DIAGNOSIS — R1011 Right upper quadrant pain: Secondary | ICD-10-CM | POA: Insufficient documentation

## 2024-01-23 DIAGNOSIS — R197 Diarrhea, unspecified: Secondary | ICD-10-CM | POA: Diagnosis not present

## 2024-01-23 LAB — URINALYSIS, ROUTINE W REFLEX MICROSCOPIC
Bilirubin Urine: NEGATIVE
Glucose, UA: NEGATIVE mg/dL
Hgb urine dipstick: NEGATIVE
Ketones, ur: 80 mg/dL — AB
Leukocytes,Ua: NEGATIVE
Nitrite: NEGATIVE
Protein, ur: 30 mg/dL — AB
Specific Gravity, Urine: 1.025 (ref 1.005–1.030)
pH: 7 (ref 5.0–8.0)

## 2024-01-23 LAB — COMPREHENSIVE METABOLIC PANEL WITH GFR
ALT: 15 U/L (ref 0–44)
AST: 18 U/L (ref 15–41)
Albumin: 4.7 g/dL (ref 3.5–5.0)
Alkaline Phosphatase: 40 U/L (ref 38–126)
Anion gap: 12 (ref 5–15)
BUN: 7 mg/dL (ref 6–20)
CO2: 24 mmol/L (ref 22–32)
Calcium: 9.2 mg/dL (ref 8.9–10.3)
Chloride: 104 mmol/L (ref 98–111)
Creatinine, Ser: 1.17 mg/dL (ref 0.61–1.24)
GFR, Estimated: >60 mL/min (ref >60)
Glucose, Bld: 105 mg/dL — ABNORMAL HIGH (ref 70–99)
Potassium: 3.6 mmol/L (ref 3.5–5.1)
Sodium: 140 mmol/L (ref 135–145)
Total Bilirubin: 1.4 mg/dL — ABNORMAL HIGH (ref 0.0–1.2)
Total Protein: 7.7 g/dL (ref 6.5–8.1)

## 2024-01-23 LAB — CBC
HCT: 42.7 % (ref 39.0–52.0)
Hemoglobin: 14.6 g/dL (ref 13.0–17.0)
MCH: 30 pg (ref 26.0–34.0)
MCHC: 34.2 g/dL (ref 30.0–36.0)
MCV: 87.7 fL (ref 80.0–100.0)
Platelets: 257 K/uL (ref 150–400)
RBC: 4.87 MIL/uL (ref 4.22–5.81)
RDW: 13.1 % (ref 11.5–15.5)
WBC: 6.7 K/uL (ref 4.0–10.5)
nRBC: 0 % (ref 0.0–0.2)

## 2024-01-23 LAB — LIPASE, BLOOD: Lipase: 42 U/L (ref 11–51)

## 2024-01-23 MED ORDER — ACETAMINOPHEN 500 MG PO TABS
1000.0000 mg | ORAL_TABLET | Freq: Once | ORAL | Status: AC
  Administered 2024-01-23: 1000 mg via ORAL
  Filled 2024-01-23: qty 2

## 2024-01-23 MED ORDER — ONDANSETRON 4 MG PO TBDP
4.0000 mg | ORAL_TABLET | Freq: Once | ORAL | Status: AC
  Administered 2024-01-23: 4 mg via ORAL
  Filled 2024-01-23: qty 1

## 2024-01-23 MED ORDER — OXYCODONE HCL 5 MG PO TABS
5.0000 mg | ORAL_TABLET | Freq: Once | ORAL | Status: AC
  Administered 2024-01-23: 5 mg via ORAL
  Filled 2024-01-23: qty 1

## 2024-01-23 MED ORDER — ONDANSETRON 4 MG PO TBDP: 4.0000 mg | ORAL_TABLET | Freq: Three times a day (TID) | ORAL | 0 refills | Status: AC | PRN

## 2024-01-23 NOTE — ED Triage Notes (Signed)
 Patient has been having RUQ and RLQ abdominal pain for the past couple days pain has been worsening. He reports most of the pain is not in the RLQ and he has a kidney stone in the right kidney. He is unsure if this pain is the kidney stone or his appendix.  Reports diarrhea this morning, cloudy urine, reports that it stings when he urinates. Mild nausea and no vomiting.

## 2024-01-23 NOTE — ED Provider Triage Note (Signed)
 Emergency Medicine Provider Triage Evaluation Note  Dave Hernandez , a 28 y.o. male  was evaluated in triage.  Pt complains of right-sided abdominal pain ongoing for the past 2 days that is progressively worsening.  No hematuria.  Some dysuria.  Has history of kidney stone.  Review of Systems  Positive: As above Negative: As above  Physical Exam  BP (!) 153/88 (BP Location: Left Arm)   Pulse 81   Temp (!) 97.5 F (36.4 C)   Resp 20   Ht 6' 3 (1.905 m)   Wt 82.6 kg   SpO2 98%   BMI 22.75 kg/m  Gen:   Awake, no distress  Resp:  Normal effort MSK:   Moves extremities without difficulty  Other:    Medical Decision Making  Medically screening exam initiated at 12:42 PM.  Appropriate orders placed.  Dave Hernandez was informed that the remainder of the evaluation will be completed by another provider, this initial triage assessment does not replace that evaluation, and the importance of remaining in the ED until their evaluation is complete.     Hildegard Loge, PA-C 01/23/24 1242

## 2024-01-23 NOTE — ED Triage Notes (Signed)
 Pt has abdominal pain which has been increasing over the past 2 days.  Pt appears in severe pain at check in.  Pain is in RLQ.  Pt has had mild nausea, no vomiting with this. Pt had diarrhea this am.

## 2024-01-23 NOTE — Discharge Instructions (Addendum)
 You were seen today for abdominal pain. While you were here we monitored your vitals, performed a physical exam, labs and CT scan. These were all reassuring and there is no indication for any further testing or intervention in the emergency department at this time.   Things to do:  - Follow up with your primary care provider during your previously scheduled appointment for repeat abdominal exam - Alternate Tylenol  and Motrin  as needed for pain - Take Zofran  as needed for nausea - Drink plenty of fluids - Take miralax 2 times a day for 3 days then decrease to once a day for 7 days  Return to the emergency department if you have any new or worsening symptoms including fevers, chills, worsening pain in your right lower quadrant when you press, or pain in your abdomen when you jump, or if you have any other concerns.

## 2024-01-23 NOTE — ED Provider Notes (Cosign Needed Addendum)
 [facility] [facility] EMERGENCY DEPARTMENT AT [facility] Provider Note   CSN: [id number] Arrival date & time: 01/23/24  1218     Patient presents with: Abdominal Pain   Dave Hernandez is a 28 y.o. male with medical history of nephrolithiasis presenting to the Emergency Department for 2 days of right sided abdominal pain.  Patient reports yesterday he began with intermittent pain that moves between his right ribs, right upper quadrant and right lower quadrant.  He denies any associated back pain.  Today he endorsed 1 episode of diarrhea with cloudy urine and dysuria.  He also endorses mild nausea with no vomiting.  Patient denies any fevers, chills, cough, congestion, or known sick contacts.    Abdominal Pain      Prior to Admission medications   Medication Sig Start Date End Date Taking? Authorizing Provider  ondansetron  (ZOFRAN -ODT) 4 MG disintegrating tablet Take 1 tablet (4 mg total) by mouth every 8 (eight) hours as needed for nausea or vomiting. 01/23/24  Yes Sharlet Dowdy, MD  acetaminophen  (TYLENOL ) 500 MG tablet Take 1 tablet (500 mg total) by mouth every 6 (six) hours for 2-3 days then take every 6 hours as needed 02/01/23   Soldatova, Li[place], MD  albuterol  (VENTOLIN  HFA) 108 (90 Base) MCG/ACT inhaler Inhale 2 puffs into the lungs every 4 (four) hours as needed for wheezing or shortness of breath (coughing fits). 04/22/23   Luke Orlan HERO, DO  levocetirizine (XYZAL) 5 MG tablet Take 5 mg by mouth at bedtime. Patient not taking: Reported on 01/09/2024 06/17/23   [provider]  pantoprazole  (PROTONIX ) 40 MG tablet Take 1 tablet (40 mg total) by mouth daily before breakfast. 11/08/23   Abran Norleen SAILOR, MD    Allergies: Penicillins    Review of Systems  Gastrointestinal:  Positive for abdominal pain.    Updated Vital Signs BP 135/74   Pulse 75   Temp (!) 97.5 F (36.4 C)   Resp 16   Ht 6' 3 (1.905 m)   Wt 82.6 kg   SpO2 99%   BMI 22.75 kg/m   Physical  Exam Vitals and nursing note reviewed.  Constitutional:      General: He is not in acute distress.    Appearance: He is well-developed.  HENT:     Head: Normocephalic and atraumatic.  Eyes:     Conjunctiva/sclera: Conjunctivae normal.  Cardiovascular:     Rate and Rhythm: Normal rate and regular rhythm.     Heart sounds: No murmur heard. Pulmonary:     Effort: Pulmonary effort is normal. No respiratory distress.     Breath sounds: Normal breath sounds.  Abdominal:     Palpations: Abdomen is soft.     Tenderness: There is no abdominal tenderness. There is no right CVA tenderness, left CVA tenderness, guarding or rebound. Negative signs include Murphy's sign, Rovsing's sign and McBurney's sign.  Musculoskeletal:        General: No swelling.     Cervical back: Neck supple.  Skin:    General: Skin is warm and dry.     Capillary Refill: Capillary refill takes less than 2 seconds.  Neurological:     Mental Status: He is alert.     (all labs ordered are listed, but only abnormal results are displayed) Labs Reviewed  COMPREHENSIVE METABOLIC PANEL WITH GFR - Abnormal; Notable for the following components:      Result Value   Glucose, Bld 105 (*)    Total Bilirubin 1.4 (*)  All other components within normal limits  URINALYSIS, ROUTINE W REFLEX MICROSCOPIC - Abnormal; Notable for the following components:   Ketones, ur 80 (*)    Protein, ur 30 (*)    Bacteria, UA RARE (*)    All other components within normal limits  LIPASE, BLOOD  CBC    EKG: None  Radiology: CT Renal Stone Study Result Date: 01/23/2024 EXAM: CT UROGRAM 01/23/2024 12:59:47 PM TECHNIQUE: CT of the abdomen and pelvis was performed without the administration of intravenous contrast. Multiplanar reformatted images as well as MIP urogram images are provided for review. Automated exposure control, iterative reconstruction, and/or weight based adjustment of the mA/kV was utilized to reduce the radiation dose to as  low as reasonably achievable. COMPARISON: 05/17/2023 CLINICAL HISTORY: Abdominal/flank pain, stone suspected. FINDINGS: LOWER CHEST: No acute abnormality. LIVER: The liver is unremarkable. GALLBLADDER AND BILE DUCTS: Gallbladder is unremarkable. No biliary ductal dilatation. SPLEEN: No acute abnormality. PANCREAS: No acute abnormality. ADRENAL GLANDS: No acute abnormality. KIDNEYS, URETERS AND BLADDER: The comparison study dated 05/17/2023 noted nonobstructive right renal calculi. On the current study, no stones are identified in the kidneys or ureters. No hydronephrosis. No perinephric or periureteral stranding. Urinary bladder is unremarkable. GI AND BOWEL: Stomach demonstrates no acute abnormality. There is no bowel obstruction. PERITONEUM AND RETROPERITONEUM: No ascites. No free air. VASCULATURE: Aorta is normal in caliber. LYMPH NODES: No lymphadenopathy. REPRODUCTIVE ORGANS: No acute abnormality. BONES AND SOFT TISSUES: No acute osseous abnormality. No focal soft tissue abnormality. IMPRESSION: 1. Nonobstructive right renal calculi. Electronically signed by: Lynwood Seip MD 01/23/2024 01:42 PM EST RP Workstation: [id number]     Procedures   Medications Ordered in the ED  ondansetron  (ZOFRAN -ODT) disintegrating tablet 4 mg (4 mg Oral Given 01/23/24 1300)  oxyCODONE  (Oxy IR/ROXICODONE ) immediate release tablet 5 mg (5 mg Oral Given 01/23/24 1300)  acetaminophen  (TYLENOL ) tablet 1,000 mg (1,000 mg Oral Given 01/23/24 1300)    Clinical Course as of 01/23/24 1641  Thu Jan 23, 2024  1510 S. 28 M, RLQ and RUQ abd pain. Labs and CT stone w/o stones.  []  UA and can d/c after reassessment [SA]    Clinical Course User Index [SA] [name], MD                                 Medical Decision Making Patient is an otherwise healthy 28 year old male with past medical history of nephrolithiasis presenting to the emergency department for evaluation of right sided abdominal pain.  Patient has had  symptoms for 2 days.  Patient is afebrile and well-appearing.  Abdominal exam is reassuring without tenderness to palpation, negative Murphy sign negative McBurney's point and negative Rovsing sign.  No guarding, rebound or rigidity.  Patient continues to endorse 4 out of 10 pain that moves from the right ribs to his right upper quadrant to right lower quadrant.   Differential diagnosis includes but not limited to nephrolithiasis, ureterolithiasis, UTI, pyelonephritis, appendicitis, cholelithiasis,  cholecystitis, gastroenteritis, constipation  Labs reassuring without leukocytosis, anemia, electrolyte abnormality, or transaminitis.  Lipase within normal limits.  UA without evidence of UTI or hematuria  Patient given Zofran  for nausea and oral Tylenol  and oxycodone  for pain control  CT stone search revealed nonobstructive right renal calculi.  I personally reviewed these images with Dr. Seip with radiology.  Patient has an incidental finding of a pelvic phlebolith which is stable and seen on previous CT imaging.  We  confirmed no evidence of obstructing ureteral stone.  On reevaluation, patient endorses improvement in his symptoms.  Patient's abdomen remains soft, nontender, nondistended.  No guarding, rebound or rigidity.  Patient without point tenderness in the right upper or lower quadrant.  Negative Murphy's and McBurney's point.  Because of this, no additional imaging indicated at this time.  Patient symptoms are likely in the setting of constipation with moderate stool burden seen on CT.  I discussed with patient taking MiraLAX for constipation and Tylenol  for pain control, Zofran  prescribed for nausea.  Patient agrees with this plan and reports he has a scheduled PCP visit tomorrow.  Strict return precautions given and patient was discharged home in stable condition.   Amount and/or Complexity of Data Reviewed External Data Reviewed: radiology. Labs: ordered. Radiology: ordered and independent  interpretation performed.  Risk Prescription drug management.        Final diagnoses:  Right upper quadrant abdominal pain    ED Discharge Orders          Ordered    ondansetron  (ZOFRAN -ODT) 4 MG disintegrating tablet  Every 8 hours PRN        01/23/24 1540               Sharlet Dowdy, MD 01/23/24 1641    Sharlet Dowdy, MD 01/23/24 1642    Neysa Caron PARAS, DO 01/24/24 0700

## 2024-01-28 NOTE — Progress Notes (Deleted)
 Follow Up Note  RE: Dave Hernandez MRN: [id number] DOB: 02/12/1996 Date of Office Visit: 01/29/2024  Referring provider: Duwaine Burnard Amble, NP Primary care provider: Duwaine Burnard Amble, NP  Chief Complaint: No chief complaint on file.  History of Present Illness: I had the pleasure of seeing Dave Hernandez for a follow up visit at the [facility] on 01/29/2024. He is a 28 y.o. male, who is being followed for reactive airway disease, chronic rhinitis and GERD. His previous allergy office visit was on 07/31/2023 with Dr. Luke. Today is a regular follow up visit.  Discussed the use of AI scribe software for clinical note transcription with the patient, who gave verbal consent to proceed.  History of Present Illness            08/21/2023 EGD: 1. 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       2. Surgical [P], gastric antrum biopsy :      -  ANTRAL MUCOSA WITH MODERATE CHRONIC INACTIVE GASTRITIS.      NOTE: WHILE AN IMMUNOHISTOCHEMICAL STAIN FOR HELICOBACTER PYLORI ORGANISMS, THE      OVERALL MORPHOLOGIC FEATURES ARE STILL CONCERNING FOR A POSSIBLE HELICOBACTER      INFECTION, CLINICAL CORRELATION WITH ADDITIONAL TESTING AS CLINICALLY INDICATED.  11/08/2018 5 GI visit: ASSESSMENT:   1.  GERD complicated by short segment Barrett's esophagus.  Doing well post endoscopy on pantoprazole . 2.  Chest pain.  Likely GERD.     PLAN:   1.  Reflux precautions 2.  Continue pantoprazole  40 mg daily.  Prescription refilled.  Medication risk reviewed 3.  Routine office follow-up 1 year 4.  Contact the office in the interim for questions or problems 5.  Planning repeat surveillance upper endoscopy in 3 years.  He understands  Assessment and  Plan: [name] is a 28 y.o. male with: Mild intermittent reactive airway disease Past history - Recent onset of intermittent shortness of breath and chest tightness, with no clear triggers. History of childhood asthma, but no inhaler use for several years. 2025 spirometry was normal pattern with 2% and 120cc improvement in FEV1 post bronchodilator treatment. Clinically feeling slightly improved. Interim history - used albuterol  for a few weeks with good benefit. Today's spirometry was normal. May use albuterol  rescue inhaler 2 puffs every 4 to 6 hours as needed for shortness of breath, chest tightness, coughing, and wheezing.  Monitor frequency of use - if you need to use it more than twice per week on a consistent basis let us  know.    Chronic rhinitis Past history - Had negative skin testing in the past per patient.  Interim history - increased symptoms in the spring. Requested records never arrived. Saw ENT as well. Requesting records again. Monitor symptoms. Use over the counter antihistamines such as Zyrtec  (cetirizine ), Claritin (loratadine), Allegra (fexofenadine), or Xyzal (levocetirizine) daily as needed. May take twice a day during allergy flares. May switch antihistamines every few months. If worsening symptoms then will recommend to retest.    Gastroesophageal reflux disease, unspecified whether esophagitis present Past history - on Pantoprazole . H/o H. Pylori? Interim history - saw GI and will need EGD. Continue lifestyle and dietary modifications. Continue pantoprazole  40mg  once day - nothing to  eat or drink for 20-30 minutes afterwards.  Follow up with GI as scheduled. Assessment and Plan              No follow-ups on file.  No orders of the defined types were placed in this encounter.  Lab Orders  No laboratory test(s) ordered today    Diagnostics: Spirometry:  Tracings reviewed. His effort: {Blank single:19197::Good reproducible efforts.,It was hard to get  consistent efforts and there is a question as to whether this reflects a maximal maneuver.,Poor effort, data can not be interpreted.} FVC: ***L FEV1: ***L, ***% predicted FEV1/FVC ratio: ***% Interpretation: {Blank single:19197::Spirometry consistent with mild obstructive disease,Spirometry consistent with moderate obstructive disease,Spirometry consistent with severe obstructive disease,Spirometry consistent with possible restrictive disease,Spirometry consistent with mixed obstructive and restrictive disease,Spirometry uninterpretable due to technique,Spirometry consistent with normal pattern,No overt abnormalities noted given today's efforts}.  Please see scanned spirometry results for details.  Skin Testing: {Blank single:19197::Select foods,Environmental allergy panel,Environmental allergy panel and select foods,Food allergy panel,None,Deferred due to recent antihistamines use}. *** Results discussed with patient/family.   Medication List:  Current Outpatient Medications  Medication Sig Dispense Refill   acetaminophen  (TYLENOL ) 500 MG tablet Take 1 tablet (500 mg total) by mouth every 6 (six) hours for 2-3 days then take every 6 hours as needed 30 tablet 0   albuterol  (VENTOLIN  HFA) 108 (90 Base) MCG/ACT inhaler Inhale 2 puffs into the lungs every 4 (four) hours as needed for wheezing or shortness of breath (coughing fits). 18 g 0   levocetirizine (XYZAL) 5 MG tablet Take 5 mg by mouth at bedtime. (Patient not taking: Reported on 01/09/2024)     ondansetron  (ZOFRAN -ODT) 4 MG disintegrating tablet Take 1 tablet (4 mg total) by mouth every 8 (eight) hours as needed for nausea or vomiting. 20 tablet 0   pantoprazole  (PROTONIX ) 40 MG tablet Take 1 tablet (40 mg total) by mouth daily before breakfast. 90 tablet 3   No current facility-administered medications for this visit.   Allergies: Allergies  Allergen Reactions   Penicillins Other (See Comments)     Childhood allergy - Patient tolerated Augmentin  well in ED without signs of anaphylaxis or any other allergic reaction.    I reviewed his past medical history, social history, family history, and environmental history and no significant changes have been reported from his previous visit.  Review of Systems  Constitutional:  Negative for appetite change, chills, fever and unexpected weight change.  HENT:  Negative for congestion and rhinorrhea.   Eyes:  Negative for itching.  Respiratory:  Negative for cough, chest tightness, shortness of breath and wheezing.   Cardiovascular:  Negative for chest pain.  Gastrointestinal:  Negative for abdominal pain.  Genitourinary:  Negative for difficulty urinating.  Skin:  Negative for rash.  Neurological:  Negative for headaches.    Objective: There were no vitals taken for this visit. There is no height or weight on file to calculate BMI. Physical Exam Vitals and nursing note reviewed.  Constitutional:      Appearance: Normal appearance. He is well-developed.  HENT:     Head: Normocephalic and atraumatic.     Right Ear: Tympanic membrane and external ear normal.     Left Ear: Tympanic membrane and external ear normal.     Nose: Nose normal.     Mouth/Throat:     Mouth: Mucous membranes are moist.     Pharynx: Oropharynx is clear.  Eyes:     Conjunctiva/sclera: Conjunctivae normal.  Cardiovascular:  Rate and Rhythm: Normal rate and regular rhythm.     Heart sounds: Normal heart sounds. No murmur heard.    No friction rub. No gallop.  Pulmonary:     Effort: Pulmonary effort is normal.     Breath sounds: Normal breath sounds. No wheezing, rhonchi or rales.  Musculoskeletal:     Cervical back: Neck supple.  Skin:    General: Skin is warm.     Findings: No rash.  Neurological:     Mental Status: He is alert and oriented to person, place, and time.  Psychiatric:        Behavior: Behavior normal.    Previous notes and tests were  reviewed. The plan was reviewed with the patient/family, and all questions/concerned were addressed.  It was my pleasure to see Goerge today and participate in his care. Please feel free to contact me with any questions or concerns.  Sincerely,  Orlan Cramp, DO Allergy & Immunology  [facility]  [place] office: [phone] [facility] office: [phone]

## 2024-01-29 ENCOUNTER — Ambulatory Visit: Payer: Self-pay | Admitting: Allergy

## 2024-01-29 DIAGNOSIS — J309 Allergic rhinitis, unspecified: Secondary | ICD-10-CM

## 2024-01-29 DIAGNOSIS — J4520 Mild intermittent asthma, uncomplicated: Secondary | ICD-10-CM

## 2024-01-29 DIAGNOSIS — K219 Gastro-esophageal reflux disease without esophagitis: Secondary | ICD-10-CM

## 2024-01-29 DIAGNOSIS — J310 Chronic rhinitis: Secondary | ICD-10-CM
